# Patient Record
Sex: Male | Born: 1955
Health system: Southern US, Community
[De-identification: ages and names within clinical notes are randomized; demographics above are authoritative.]

## PROBLEM LIST (undated history)

## (undated) DIAGNOSIS — C679 Malignant neoplasm of bladder, unspecified: Secondary | ICD-10-CM

## (undated) DIAGNOSIS — I714 Abdominal aortic aneurysm, without rupture, unspecified: Secondary | ICD-10-CM

## (undated) DIAGNOSIS — I10 Essential (primary) hypertension: Secondary | ICD-10-CM

## (undated) DIAGNOSIS — E119 Type 2 diabetes mellitus without complications: Secondary | ICD-10-CM

## (undated) DIAGNOSIS — C801 Malignant (primary) neoplasm, unspecified: Secondary | ICD-10-CM

## (undated) HISTORY — PX: HERNIA REPAIR: SHX51

## (undated) HISTORY — DX: Malignant neoplasm of bladder, unspecified: C67.9

## (undated) HISTORY — PX: BLADDER SURGERY: SHX569

## (undated) HISTORY — PX: ILEOSTOMY: SHX1783

---

## 2018-01-13 ENCOUNTER — Encounter (HOSPITAL_BASED_OUTPATIENT_CLINIC_OR_DEPARTMENT_OTHER): Payer: Self-pay | Admitting: *Deleted

## 2018-01-13 ENCOUNTER — Emergency Department (HOSPITAL_BASED_OUTPATIENT_CLINIC_OR_DEPARTMENT_OTHER): Payer: Non-veteran care

## 2018-01-13 ENCOUNTER — Emergency Department (HOSPITAL_BASED_OUTPATIENT_CLINIC_OR_DEPARTMENT_OTHER)
Admission: EM | Admit: 2018-01-13 | Discharge: 2018-01-13 | Disposition: A | Discharge status: Home / Self Care | Payer: Non-veteran care | Attending: Emergency Medicine | Admitting: Emergency Medicine

## 2018-01-13 ENCOUNTER — Other Ambulatory Visit: Payer: Self-pay

## 2018-01-13 DIAGNOSIS — E119 Type 2 diabetes mellitus without complications: Secondary | ICD-10-CM | POA: Insufficient documentation

## 2018-01-13 DIAGNOSIS — Z8551 Personal history of malignant neoplasm of bladder: Secondary | ICD-10-CM | POA: Insufficient documentation

## 2018-01-13 DIAGNOSIS — R531 Weakness: Secondary | ICD-10-CM | POA: Insufficient documentation

## 2018-01-13 DIAGNOSIS — R000 Tachycardia, unspecified: Secondary | ICD-10-CM | POA: Diagnosis present

## 2018-01-13 DIAGNOSIS — F17200 Nicotine dependence, unspecified, uncomplicated: Secondary | ICD-10-CM | POA: Diagnosis not present

## 2018-01-13 DIAGNOSIS — R1012 Left upper quadrant pain: Secondary | ICD-10-CM | POA: Insufficient documentation

## 2018-01-13 DIAGNOSIS — I10 Essential (primary) hypertension: Secondary | ICD-10-CM | POA: Insufficient documentation

## 2018-01-13 HISTORY — DX: Abdominal aortic aneurysm, without rupture, unspecified: I71.40

## 2018-01-13 HISTORY — DX: Type 2 diabetes mellitus without complications: E11.9

## 2018-01-13 HISTORY — DX: Abdominal aortic aneurysm, without rupture: I71.4

## 2018-01-13 HISTORY — DX: Malignant (primary) neoplasm, unspecified: C80.1

## 2018-01-13 HISTORY — DX: Essential (primary) hypertension: I10

## 2018-01-13 LAB — CBC WITH DIFFERENTIAL/PLATELET
BASOS ABS: 0 10*3/uL (ref 0.0–0.1)
BASOS PCT: 0 %
EOS ABS: 0.3 10*3/uL (ref 0.0–0.7)
Eosinophils Relative: 3 %
HCT: 46.1 % (ref 39.0–52.0)
HEMOGLOBIN: 16.5 g/dL (ref 13.0–17.0)
Lymphocytes Relative: 23 %
Lymphs Abs: 2.2 10*3/uL (ref 0.7–4.0)
MCH: 29.7 pg (ref 26.0–34.0)
MCHC: 35.8 g/dL (ref 30.0–36.0)
MCV: 82.9 fL (ref 78.0–100.0)
MONO ABS: 0.8 10*3/uL (ref 0.1–1.0)
MONOS PCT: 8 %
NEUTROS ABS: 6.1 10*3/uL (ref 1.7–7.7)
NEUTROS PCT: 66 %
Platelets: 337 10*3/uL (ref 150–400)
RBC: 5.56 MIL/uL (ref 4.22–5.81)
RDW: 14 % (ref 11.5–15.5)
WBC: 9.4 10*3/uL (ref 4.0–10.5)

## 2018-01-13 LAB — BASIC METABOLIC PANEL
ANION GAP: 11 (ref 5–15)
BUN: 14 mg/dL (ref 6–20)
CALCIUM: 9.5 mg/dL (ref 8.9–10.3)
CO2: 20 mmol/L — AB (ref 22–32)
CREATININE: 0.84 mg/dL (ref 0.61–1.24)
Chloride: 104 mmol/L (ref 101–111)
GFR calc non Af Amer: >60 mL/min (ref >60)
Glucose, Bld: 268 mg/dL — ABNORMAL HIGH (ref 65–99)
Potassium: 3.7 mmol/L (ref 3.5–5.1)
Sodium: 135 mmol/L (ref 135–145)

## 2018-01-13 LAB — TROPONIN I

## 2018-01-13 NOTE — ED Notes (Signed)
ED Provider at bedside. 

## 2018-01-13 NOTE — ED Provider Notes (Signed)
[facility] Provider Note   CSN: [id number] Arrival date & time: 01/13/18  1251     History   Chief Complaint Chief Complaint  Patient presents with  . Abdominal Pain    HPI Shaun Pittman is a 62 y.o. male history of bladder cancer status post removal, AAA (last screening measuring 3.2 cm), diabetes, hypertension who presents emergency department today for elevated heart rate.  Patient states that he is concerned he may have ruptured his AAA.  He is concerned because his mother died from a ruptured AAA several years ago.  He notes that he has been having left upper abdominal pain that is crampy in nature for the last 6 months.  This morning he had 3 episodes of nonbloody diarrhea and noticed relief of his pain.  He reports that shortly after this he started having elevated heart rate around 160 as well as an elevated blood pressure of 152/98.  He notes that his symptoms resolved.  He reports earlier that day he did feel he had some left arm weakness but feels this may be related to soreness as he did heavy lifting a few days ago.  He notes this has resolved.  Patient states he is asymptomatic currently. 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 He denies any associated chest pain, shortness of breath, headache, facial droop, difficulty with hearing, changes in vision, diplopia, difficulty with speech, numbness/tingling of the upper extremities, weakness of the lower extremities, vertigo, dizziness, headedness, nausea or vomiting. Patient has a history of bladder removal 4 years ago.  He has a urostomy in place without any complications.  HPI  Past Medical History:  Diagnosis Date  . AAA (abdominal aortic aneurysm) ([place])   . Cancer ([place])   . Diabetes mellitus without complication ([place])   . Hypertension     There are no active problems to display for this patient.   Past  Surgical History:  Procedure Laterality Date  . BLADDER SURGERY    . HERNIA REPAIR    . ILEOSTOMY          Home Medications    Prior to Admission medications   Not on File    Family History No family history on file.  Social History Social History   Tobacco Use  . Smoking status: Current Every Day Smoker  . Smokeless tobacco: Never Used  Substance Use Topics  . Alcohol use: Never    Frequency: Never  . Drug use: Never     Allergies   Sulfa antibiotics and Zithromax [azithromycin]   Review of Systems Review of Systems  All other systems reviewed and are negative.    Physical Exam Updated Vital Signs BP 136/84 (BP Location: Right Arm)   Pulse (!) 106   Temp 97.9 F (36.6 C) (Oral)   Resp 20   Ht 6' (1.829 m)   Wt 80.7 kg (178 lb)   SpO2 99%   BMI 24.14 kg/m   Physical Exam  Constitutional: He appears well-developed and well-nourished.  62 year old male that appears stated age resting comfortably no acute distress  HENT:  Head: Normocephalic and atraumatic.  Right Ear: External ear normal.  Left Ear: External ear normal.  Nose: Nose normal.  Mouth/Throat: Uvula is midline, oropharynx is clear and moist and mucous membranes are normal. No tonsillar exudate.  Eyes: Pupils are equal, round, and reactive to  light. Right eye exhibits no discharge. Left eye exhibits no discharge. No scleral icterus.  Neck: Trachea normal. Neck supple. No spinous process tenderness present. No neck rigidity. Normal range of motion present.  Cardiovascular: Regular rhythm and intact distal pulses. Tachycardia present.  No murmur heard. Pulses:      Radial pulses are 2+ on the right side, and 2+ on the left side.       Femoral pulses are 2+ on the right side, and 2+ on the left side.      Dorsalis pedis pulses are 2+ on the right side, and 2+ on the left side.       Posterior tibial pulses are 2+ on the right side, and 2+ on the left side.  No lower extremity swelling or  edema. Calves symmetric in size bilaterally.  Pulmonary/Chest: Effort normal and breath sounds normal. He exhibits no tenderness.  Abdominal: Soft. Bowel sounds are normal. He exhibits no distension and no pulsatile midline mass. There is no tenderness. There is no rigidity, no rebound, no guarding and no CVA tenderness.  Urostomy bag in place with no surrounding evidence of infection  Musculoskeletal: He exhibits no edema.  Lymphadenopathy:    He has no cervical adenopathy.  Neurological: He is alert.  Mental Status:  Alert, oriented, thought content appropriate, able to give a coherent history. Speech fluent without evidence of aphasia. Able to follow 2 step commands without difficulty.  Cranial Nerves:  II:  Peripheral visual fields grossly normal, pupils equal, round, reactive to light III,IV, VI: ptosis not present, extra-ocular motions intact bilaterally  V,VII: eyebrows raise symmetric, facial light touch sensation equal VIII: hearing grossly normal to voice  X: uvula elevates symmetrically  XI: bilateral shoulder shrug symmetric and strong XII: midline tongue extension without fassiculations Motor:  Normal tone. 5/5 in upper and lower extremities bilaterally including strong and equal grip strength and dorsiflexion/plantar flexion Sensory: Sensation intact to light touch in all extremities. Negative Romberg.  Deep Tendon Reflexes: 2+ and symmetric in the biceps and patella Cerebellar: normal finger-to-nose with bilateral upper extremities. Normal heel-to -shin balance bilaterally of the lower extremity. No pronator drift.  Gait: normal gait and balance CV: distal pulses palpable throughout   Skin: Skin is warm and dry. No rash noted. He is not diaphoretic.  Psychiatric: He has a normal mood and affect.  Nursing note and vitals reviewed.    ED Treatments / Results  Labs (all labs ordered are listed, but only abnormal results are displayed) Labs Reviewed  BASIC METABOLIC  PANEL - Abnormal; Notable for the following components:      Result Value   CO2 20 (*)    Glucose, Bld 268 (*)    All other components within normal limits  CBC WITH DIFFERENTIAL/PLATELET  TROPONIN I    EKG EKG Interpretation  Date/Time:  Thursday Jan 13 2018 12:59:10 EDT Ventricular Rate:  106 PR Interval:  180 QRS Duration: 66 QT Interval:  310 QTC Calculation: 411 R Axis:   71 Text Interpretation:  Sinus tachycardia Otherwise normal ECG No old tracing to compare Confirmed by Jola Schmidt [phone]) on 01/13/2018 1:45:18 PM   Radiology Dg Chest 2 View  Result Date: 01/13/2018 CLINICAL DATA:  Rapid heart rate yesterday and this am, chest pressure, light headed. Hx AAA, enlarged spleen. EXAM: CHEST - 2 VIEW COMPARISON:  None. FINDINGS: The heart size and mediastinal contours are within normal limits. Both lungs are clear. The visualized skeletal structures are unremarkable. IMPRESSION: No active  cardiopulmonary disease. Electronically Signed   By: Nolon Nations M.D.   On: 01/13/2018 13:57   Ct Head Wo Contrast  Result Date: 01/13/2018 CLINICAL DATA:  Hypertension, dizziness, tachycardia, history of bladder carcinoma EXAM: CT HEAD WITHOUT CONTRAST TECHNIQUE: Contiguous axial images were obtained from the base of the skull through the vertex without intravenous contrast. COMPARISON:  None. FINDINGS: Brain: The ventricular system is within normal limits in size age with only mild cortical atrophy present. The septum is midline in position. The fourth ventricle and basilar cisterns are unremarkable. There may be very mild small vessel disease involving the periventricular white matter but no hemorrhage, mass lesion, or acute infarction is seen. Vascular: No vascular abnormality is noted on this unenhanced study. Skull: On bone window images, no calvarial abnormality is seen. Sinuses/Orbits: No paranasal sinus disease is seen. Other: None. IMPRESSION: 1. No acute intracranial abnormality. 2.  Very mild atrophy and perhaps minimal small vessel ischemic change in the periventricular white matter. Electronically Signed   By: Ivar Drape M.D.   On: 01/13/2018 13:52    Procedures Procedures (including critical care time)  Medications Ordered in ED Medications - No data to display   Initial Impression / Assessment and Plan / ED Course  I have reviewed the triage vital signs and the nursing notes.  Pertinent labs & imaging results that were available during my care of the patient were reviewed by me and considered in my medical decision making (see chart for details).     62 y.o. male history of bladder cancer status post removal, AAA (last screening measuring 3.2 cm), diabetes, hypertension who presents emergency department today for elevated heart rate.  Patient states that he is concerned he may have ruptured his AAA.  He is concerned because his mother died from a ruptured AAA several years ago.  He notes that he has been having left upper abdominal pain that is crampy in nature for the last 6 months.  This morning he had 3 episodes of nonbloody diarrhea and noticed relief of his pain.  He reports that shortly after this he started having elevated heart rate around 160 as well as an elevated blood pressure of 152/98.  He notes that his symptoms resolved.  Reports some recent increase in stress. He denies any associated chest pain, shortness of breath, headache, facial droop, difficulty with hearing, changes in vision, diplopia, difficulty with speech, numbness/tingling of the upper extremities, weakness of the lower extremities, vertigo, dizziness, headedness, nausea or vomiting.   Patient resting comfortably on my initial exam.  He is tachycardic at 106.  Vital signs are otherwise reassuring.  No JVD, carotid bruit.  Lungs are clear to auscultation bilaterally.  Heart is tachycardic without an irregular rhythm.  Abdomen is soft and nontender.  He has a urostomy bag present that is  without any surrounding signs of infection.  Bedside ultrasound performed by Dr. Venora Maples that reveals a aortic diameter of 2.75 cm.  Patient with normal neurologic exam.  No focal neurologic deficits.  Pulses of upper and lower extremities equal bilaterally.  Femoral pulses equal bilaterally.  Low suspicion for rupture of AAA at this time given patient's reassuring exam and patient is without any pain at this current time.  Blood pressures taken bilaterally are symmetric.  Question if the patient had an episode of atrial fibrillation.  Will work him up for this.  EKG was sinus tachycardia.  No evidence of atrial fibrillation or other arrhythmia at this time.  CT  head without any acute intracranial abnormality.  Chest x-ray with any active pulmonary disease.  Troponin within normal limits.  Lab work otherwise reassuring. Vital signs have improved.   The evaluation does not show pathology that would require ongoing emergent intervention or inpatient treatment. I advised the patient to follow-up with PCP at [facility] during his scheduled appointment tomorrow morning. I will provide referral to  week. I advised the patient to return to the emergency department with new or worsening symptoms or new concerns. Specific return precautions discussed. The patient verbalized understanding and agreement with plan. All questions answered. No further questions at this time. The patient is hemodynamically stable, mentating appropriately and appears safe for discharge.   Vitals:   01/13/18 1258 01/13/18 1317 01/13/18 1318 01/13/18 1405  BP: (!) 155/88 (!) 154/93 136/84 130/79  Pulse: (!) 106   85  Resp:    18  Temp:      TempSrc:      SpO2: 99%   95%  Weight:      Height:        Final Clinical Impressions(s) / ED Diagnoses   Final diagnoses:  Tachycardia    ED Discharge Orders    None       Lorelle Gibbs 01/13/18 2029    Jola Schmidt, MD 01/18/18 1006

## 2018-01-13 NOTE — ED Triage Notes (Signed)
Left upper quadrant pain for months. Hx of AAA that has never ruptured and enlarged spleen. Left arm is weak.

## 2018-01-13 NOTE — Discharge Instructions (Addendum)
Your work-up was reassuring today. You received blood work, EKG, CT of the head as well as a chest x-ray Your bedside ultrasound was reassuring I am referring you to a cardiologist for further evaluation.  It is possible that you had an episode of atrial fibrillation but we do not see this during your stay in the emergency department. Please call today when you leave today for follow up in the near future.  Keep the appointment with your [facility] doctor tomorrow and discuss the results and today's visit Return for any visual changes, blurring of vision, double vision, changes in hearing, difficulty with speech, weakness on one side of your body, chest pain, shortness of breath, repeat of today's events, increasing abdominal pain or any other concerning symptoms.

## 2018-01-31 ENCOUNTER — Encounter (HOSPITAL_BASED_OUTPATIENT_CLINIC_OR_DEPARTMENT_OTHER): Payer: Self-pay | Admitting: Emergency Medicine

## 2018-01-31 ENCOUNTER — Emergency Department (HOSPITAL_BASED_OUTPATIENT_CLINIC_OR_DEPARTMENT_OTHER)
Admission: EM | Admit: 2018-01-31 | Discharge: 2018-01-31 | Disposition: A | Discharge status: Home / Self Care | Payer: Non-veteran care | Attending: Physician Assistant | Admitting: Physician Assistant

## 2018-01-31 ENCOUNTER — Emergency Department (HOSPITAL_BASED_OUTPATIENT_CLINIC_OR_DEPARTMENT_OTHER): Payer: Non-veteran care

## 2018-01-31 ENCOUNTER — Other Ambulatory Visit: Payer: Self-pay

## 2018-01-31 DIAGNOSIS — I10 Essential (primary) hypertension: Secondary | ICD-10-CM | POA: Diagnosis not present

## 2018-01-31 DIAGNOSIS — F17200 Nicotine dependence, unspecified, uncomplicated: Secondary | ICD-10-CM | POA: Insufficient documentation

## 2018-01-31 DIAGNOSIS — E119 Type 2 diabetes mellitus without complications: Secondary | ICD-10-CM | POA: Diagnosis not present

## 2018-01-31 DIAGNOSIS — J00 Acute nasopharyngitis [common cold]: Secondary | ICD-10-CM | POA: Insufficient documentation

## 2018-01-31 DIAGNOSIS — Z79899 Other long term (current) drug therapy: Secondary | ICD-10-CM | POA: Diagnosis not present

## 2018-01-31 DIAGNOSIS — R0982 Postnasal drip: Secondary | ICD-10-CM | POA: Diagnosis present

## 2018-01-31 DIAGNOSIS — Z7984 Long term (current) use of oral hypoglycemic drugs: Secondary | ICD-10-CM | POA: Insufficient documentation

## 2018-01-31 DIAGNOSIS — Z8551 Personal history of malignant neoplasm of bladder: Secondary | ICD-10-CM | POA: Diagnosis not present

## 2018-01-31 LAB — CBC WITH DIFFERENTIAL/PLATELET
BASOS ABS: 0 10*3/uL (ref 0.0–0.1)
BASOS PCT: 0 %
EOS ABS: 0.1 10*3/uL (ref 0.0–0.7)
Eosinophils Relative: 1 %
HCT: 44.4 % (ref 39.0–52.0)
Hemoglobin: 16 g/dL (ref 13.0–17.0)
Lymphocytes Relative: 16 %
Lymphs Abs: 1.7 10*3/uL (ref 0.7–4.0)
MCH: 30 pg (ref 26.0–34.0)
MCHC: 36 g/dL (ref 30.0–36.0)
MCV: 83.1 fL (ref 78.0–100.0)
MONO ABS: 0.9 10*3/uL (ref 0.1–1.0)
MONOS PCT: 8 %
Neutro Abs: 7.7 10*3/uL (ref 1.7–7.7)
Neutrophils Relative %: 75 %
PLATELETS: 309 10*3/uL (ref 150–400)
RBC: 5.34 MIL/uL (ref 4.22–5.81)
RDW: 13.8 % (ref 11.5–15.5)
WBC: 10.5 10*3/uL (ref 4.0–10.5)

## 2018-01-31 LAB — COMPREHENSIVE METABOLIC PANEL
ALBUMIN: 4.1 g/dL (ref 3.5–5.0)
ALK PHOS: 76 U/L (ref 38–126)
ALT: 19 U/L (ref 17–63)
AST: 21 U/L (ref 15–41)
Anion gap: 12 (ref 5–15)
BILIRUBIN TOTAL: 0.7 mg/dL (ref 0.3–1.2)
BUN: 14 mg/dL (ref 6–20)
CALCIUM: 9.2 mg/dL (ref 8.9–10.3)
CO2: 19 mmol/L — ABNORMAL LOW (ref 22–32)
CREATININE: 0.79 mg/dL (ref 0.61–1.24)
Chloride: 105 mmol/L (ref 101–111)
GFR calc non Af Amer: >60 mL/min (ref >60)
GLUCOSE: 188 mg/dL — AB (ref 65–99)
Potassium: 3.8 mmol/L (ref 3.5–5.1)
Sodium: 136 mmol/L (ref 135–145)
TOTAL PROTEIN: 7.6 g/dL (ref 6.5–8.1)

## 2018-01-31 LAB — D-DIMER, QUANTITATIVE (NOT AT ARMC): D DIMER QUANT: 0.41 ug{FEU}/mL (ref 0.00–0.50)

## 2018-01-31 NOTE — ED Notes (Signed)
NAD at this time. Pt is stable and going home.  

## 2018-01-31 NOTE — ED Provider Notes (Signed)
[facility] Provider Note   CSN: [id number] Arrival date & time: 01/31/18  1023     History   Chief Complaint Chief Complaint  Patient presents with  . Cough    HPI Joaquim Tolen is a 62 y.o. male.  HPI   Patient is a 62 year old male with past medical history significant for AAA, bladder cancer with colostomy, diabetes, hypertension.  He is presenting today with coughing up blood.  Patient reports that he has had "a [place] time trouble with his sinuses".  Patient has been seen by multiple physicians and told that he should not have surgery but they are very narrow.  Patient has noticed that he has been having some more sinus drainage and then coughing up small amounts of blood with the mucus.  Patient does not believe that he is coughing up from his chest, he thinks it is most likely mucosal.  However he is worried about a blood clot today.  Patient had no leg swelling.  No recent travel.  Patient was recently seen in the emergency department and diagnosed with tachycardia with follow-up Holter monitor that was recently returned to the [place].  Patient denies any chest pain or shortness of breath.  However endorses the 2-day history of coughing up blood.  Past Medical History:  Diagnosis Date  . AAA (abdominal aortic aneurysm) ([place])   . Cancer ([place])   . Diabetes mellitus without complication ([place])   . Hypertension     There are no active problems to display for this patient.   Past Surgical History:  Procedure Laterality Date  . BLADDER SURGERY    . HERNIA REPAIR    . ILEOSTOMY          Home Medications    Prior to Admission medications   Medication Sig Start Date End Date Taking? Authorizing Provider  Alogliptin Benzoate 25 MG TABS Take by mouth.   Yes [provider]  amLODipine (NORVASC) 10 MG tablet Take 10 mg by mouth daily.   Yes [provider]  metFORMIN (GLUCOPHAGE) 1000 MG tablet Take 1,000 mg by mouth 2 (two)  times daily with a meal.   Yes [provider]    Family History History reviewed. No pertinent family history.  Social History Social History   Tobacco Use  . Smoking status: Current Every Day Smoker  . Smokeless tobacco: Never Used  Substance Use Topics  . Alcohol use: Never    Frequency: Never  . Drug use: Never     Allergies   Sulfa antibiotics and Zithromax [azithromycin]   Review of Systems Review of Systems  Constitutional: Positive for fatigue. Negative for activity change and fever.  HENT: Positive for congestion, postnasal drip, sinus pressure and sinus pain. Negative for voice change.   Respiratory: Negative for shortness of breath.   Cardiovascular: Negative for chest pain.  Gastrointestinal: Negative for abdominal pain.  All other systems reviewed and are negative.    Physical Exam Updated Vital Signs BP (!) 149/89 (BP Location: Left Arm)   Pulse (!) 106   Temp 98.9 F (37.2 C) (Oral)   Resp 18   Ht 6' (1.829 m)   Wt 80.7 kg (178 lb)   SpO2 95%   BMI 24.14 kg/m   Physical Exam  Constitutional: He is oriented to person, place, and time. He appears well-nourished.  HENT:  Head: Normocephalic.  Mild erythema to bilateral turbinates.  Eyes: Conjunctivae are normal.  Cardiovascular: Normal rate and regular rhythm.  Pulmonary/Chest: Effort normal. No stridor. No respiratory distress.  Abdominal: Soft. He exhibits no distension. There is no tenderness.  Neurological: He is oriented to person, place, and time.  Skin: Skin is warm and dry. He is not diaphoretic.  Psychiatric: He has a normal mood and affect. His behavior is normal.     ED Treatments / Results  Labs (all labs ordered are listed, but only abnormal results are displayed) Labs Reviewed  COMPREHENSIVE METABOLIC PANEL  CBC WITH DIFFERENTIAL/PLATELET  D-DIMER, QUANTITATIVE (NOT AT [facility])    EKG None  Radiology Dg Chest 2 View  Result Date: 01/31/2018 CLINICAL DATA:   Hemoptysis.  Cough. EXAM: CHEST - 2 VIEW COMPARISON:  None. FINDINGS: The heart size is normal. There is no edema or effusion. No focal airspace disease present. Interstitial coarsening is present bilaterally. Degenerative changes are present in the thoracic spine. Surgical clips are present at the gallbladder fossa. IMPRESSION: 1. No acute cardiopulmonary disease. 2. Mild interstitial coarsening is likely chronic. Electronically Signed   By: San Morelle M.D.   On: 01/31/2018 10:56    Procedures Procedures (including critical care time)  Medications Ordered in ED Medications - No data to display   Initial Impression / Assessment and Plan / ED Course  I have reviewed the triage vital signs and the nursing notes.  Pertinent labs & imaging results that were available during my care of the patient were reviewed by me and considered in my medical decision making (see chart for details).      Patient is a 62 year old male with past medical history significant for AAA, bladder cancer with colostomy, diabetes, hypertension.  He is presenting today with coughing up blood.  Patient reports that he has had "a [place] time trouble with his sinuses".  Patient has been seen by multiple physicians and told that he should not have surgery but they are very narrow.  Patient has noticed that he has been having some more sinus drainage and then coughing up small amounts of blood with the mucus.  Patient does not believe that he is coughing up from his chest, he thinks it is most likely mucosal.  However he is worried about a blood clot today.  Patient had no leg swelling.  No recent travel.  Patient was recently seen in the emergency department and diagnosed with tachycardia with follow-up Holter monitor that was recently returned to the [place].  Patient denies any chest pain or shortness of breath.  However endorses the 2-day history of coughing up blood.  12:05 PM Very much suspect that this is due to  friable mucosa secondary to multiple allergies in the setting of pollen and a new dog.  However given high risk (new tachycardia along with history of bladder cancer) will get d-dimer.  Chest x-ray is reassuring.  D-dimer negative. Will have follow up with PCP.  Final Clinical Impressions(s) / ED Diagnoses   Final diagnoses:  None    ED Discharge Orders    None       [place], Fredia Sorrow, MD 01/31/18 1258

## 2018-01-31 NOTE — ED Triage Notes (Signed)
Patient states that yesterday he has a "weird headache" and since he has had sinus drainage into his thraat and cough with clots in it.

## 2018-01-31 NOTE — Discharge Instructions (Addendum)
We think that this is likely irritation of your mucosa.  Please use coolmist or warm mist vaporizer.  Please stay hydrated.  Please stay away from allergens such as dog dander, or pollen.  Please follow-up with your primary care physician for further work-up.

## 2018-06-08 ENCOUNTER — Other Ambulatory Visit (HOSPITAL_BASED_OUTPATIENT_CLINIC_OR_DEPARTMENT_OTHER): Payer: Self-pay | Admitting: Family Medicine

## 2018-06-08 DIAGNOSIS — I714 Abdominal aortic aneurysm, without rupture, unspecified: Secondary | ICD-10-CM

## 2018-06-15 ENCOUNTER — Ambulatory Visit (HOSPITAL_BASED_OUTPATIENT_CLINIC_OR_DEPARTMENT_OTHER)
Admission: RE | Admit: 2018-06-15 | Discharge: 2018-06-15 | Disposition: A | Discharge status: Home / Self Care | Payer: No Typology Code available for payment source | Source: Ambulatory Visit | Attending: Family Medicine | Admitting: Family Medicine

## 2018-06-15 DIAGNOSIS — I714 Abdominal aortic aneurysm, without rupture, unspecified: Secondary | ICD-10-CM

## 2019-08-13 ENCOUNTER — Other Ambulatory Visit: Payer: Self-pay

## 2019-08-13 ENCOUNTER — Emergency Department (HOSPITAL_BASED_OUTPATIENT_CLINIC_OR_DEPARTMENT_OTHER)
Admission: EM | Admit: 2019-08-13 | Discharge: 2019-08-14 | Disposition: A | Discharge status: Home / Self Care | Payer: No Typology Code available for payment source | Attending: Emergency Medicine | Admitting: Emergency Medicine

## 2019-08-13 ENCOUNTER — Encounter (HOSPITAL_BASED_OUTPATIENT_CLINIC_OR_DEPARTMENT_OTHER): Payer: Self-pay | Admitting: Emergency Medicine

## 2019-08-13 DIAGNOSIS — Z79899 Other long term (current) drug therapy: Secondary | ICD-10-CM | POA: Insufficient documentation

## 2019-08-13 DIAGNOSIS — Z7984 Long term (current) use of oral hypoglycemic drugs: Secondary | ICD-10-CM | POA: Diagnosis not present

## 2019-08-13 DIAGNOSIS — I10 Essential (primary) hypertension: Secondary | ICD-10-CM | POA: Insufficient documentation

## 2019-08-13 DIAGNOSIS — Z859 Personal history of malignant neoplasm, unspecified: Secondary | ICD-10-CM | POA: Insufficient documentation

## 2019-08-13 DIAGNOSIS — R310 Gross hematuria: Secondary | ICD-10-CM | POA: Diagnosis not present

## 2019-08-13 DIAGNOSIS — F17200 Nicotine dependence, unspecified, uncomplicated: Secondary | ICD-10-CM | POA: Diagnosis not present

## 2019-08-13 DIAGNOSIS — E119 Type 2 diabetes mellitus without complications: Secondary | ICD-10-CM | POA: Diagnosis not present

## 2019-08-13 DIAGNOSIS — R319 Hematuria, unspecified: Secondary | ICD-10-CM | POA: Diagnosis present

## 2019-08-13 NOTE — ED Triage Notes (Signed)
Patient states that he started to have noted bleeding to his urine through his urostomy bag starting tonight - the patient states that the urine is now clear after emptying the bag PTA

## 2019-08-14 NOTE — ED Provider Notes (Signed)
[facility] EMERGENCY DEPARTMENT Provider Note   CSN: [id number] Arrival date & time: 08/13/19  2155     History   Chief Complaint Chief Complaint  Patient presents with  . Hematuria    HPI Shaun Pittman is a 63 y.o. male.     HPI  This is a 63 year old male with a history of bladder cancer status post cystectomy and urostomy placement, diabetes, hypertension who presents with bloody urine noted in his urostomy bag.  Patient reports that around 8:00 he noted frank blood in his urostomy bag.  He drained the bag and blood continue to accumulate.  He then changed his urostomy bag and has not noted any bleeding since.  That was approximately 4 hours ago.  He states that he cuts his own bags and suspects that he may have irritated the stoma with a jagged area around the hole.  He is otherwise asymptomatic.  No back pains, no fever.  He states his stoma is fairly insensate so he cannot tell me whether it hurts at all.  Past Medical History:  Diagnosis Date  . AAA (abdominal aortic aneurysm) ([place])   . Cancer ([place])   . Diabetes mellitus without complication ([place])   . Hypertension     There are no active problems to display for this patient.   Past Surgical History:  Procedure Laterality Date  . BLADDER SURGERY    . HERNIA REPAIR    . ILEOSTOMY          Home Medications    Prior to Admission medications   Medication Sig Start Date End Date Taking? Authorizing Provider  Alogliptin Benzoate 25 MG TABS Take by mouth.    [provider]  amLODipine (NORVASC) 10 MG tablet Take 10 mg by mouth daily.    [provider]  metFORMIN (GLUCOPHAGE) 1000 MG tablet Take 1,000 mg by mouth 2 (two) times daily with a meal.    [provider]    Family History History reviewed. No pertinent family history.  Social History Social History   Tobacco Use  . Smoking status: Current Every Day Smoker  . Smokeless tobacco: Never Used  Substance Use Topics   . Alcohol use: Never    Frequency: Never  . Drug use: Never     Allergies   Sulfa antibiotics and Zithromax [azithromycin]   Review of Systems Review of Systems  Constitutional: Negative for fever.  Gastrointestinal: Negative for abdominal pain.  Genitourinary: Positive for hematuria. Negative for flank pain.  Skin: Positive for color change.  All other systems reviewed and are negative.    Physical Exam Updated Vital Signs BP (!) 162/83 (BP Location: Left Arm)   Pulse 78   Temp 98.6 F (37 C) (Oral)   Resp 18   Ht 1.829 m (6')   Wt 80.7 kg   SpO2 100%   BMI 24.14 kg/m   Physical Exam Vitals signs and nursing note reviewed.  Constitutional:      Appearance: He is well-developed. He is not ill-appearing.  HENT:     Head: Normocephalic and atraumatic.  Eyes:     Pupils: Pupils are equal, round, and reactive to light.  Neck:     Musculoskeletal: Neck supple.  Cardiovascular:     Rate and Rhythm: Normal rate and regular rhythm.  Pulmonary:     Effort: Pulmonary effort is normal. No respiratory distress.  Abdominal:     Palpations: Abdomen is soft.     Tenderness: There is no  abdominal tenderness. There is no rebound.     Comments: Urostomy in right mid abdomen, stoma pink without obvious bleeding, there is slight irritation inferiorly, clear urine noted in the urostomy bag, upon removal of the urostomy bag there is some skin irritation inferiorly  Skin:    General: Skin is warm and dry.  Neurological:     Mental Status: He is alert and oriented to person, place, and time.  Psychiatric:        Mood and Affect: Mood normal.      ED Treatments / Results  Labs (all labs ordered are listed, but only abnormal results are displayed) Labs Reviewed - No data to display  EKG None  Radiology No results found.  Procedures Procedures (including critical care time)  Medications Ordered in ED Medications - No data to display   Initial Impression /  Assessment and Plan / ED Course  I have reviewed the triage vital signs and the nursing notes.  Pertinent labs & imaging results that were available during my care of the patient were reviewed by me and considered in my medical decision making (see chart for details).        Patient presents with noted blood in his urostomy.  He is overall nontoxic and vital signs are reassuring.  He did now has clear urine and has for the last 4 hours.  Urine cleared up after changing his urostomy bag.  Patient suspects irritation at this time with his prior bag.  There is no obvious injury to the stoma but there is some irritation inferiorly which could suggest recent bleeding.  Patient is otherwise asymptomatic and his abdomen is soft.  At this time, feel very reassured that his urine has cleared up and have low suspicion for intra-abdominal bleeding.  Suspect that he likely had some external bleeding from his stoma which was corrected with removal of irritation.  Patient was reassured.  I did encourage him to follow-up with his urologist.  Do not see any utility in sending urine at this time.  After history, exam, and medical workup I feel the patient has been appropriately medically screened and is safe for discharge home. Pertinent diagnoses were discussed with the patient. Patient was given return precautions.   Final Clinical Impressions(s) / ED Diagnoses   Final diagnoses:  Gross hematuria    ED Discharge Orders    None       Captain Blucher, Barbette Hair, MD 08/14/19 [phone]

## 2019-08-14 NOTE — Discharge Instructions (Addendum)
Your seen today after he noted blood in your urostomy bag.  There was no notable blood on examination and your stoma appeared intact although there was some irritation inferiorly.  Given that the urine has cleared up, I do not feel you need further work-up at this time.  To call your urologist for follow-up.  If you note gross blood or have any change in symptoms you should be reevaluated.

## 2019-09-06 IMAGING — CT CT HEAD W/O CM
3 series · 14 of 46 positions shown, 16 images · non-contrast
Comparison: None.

CLINICAL DATA: Hypertension, dizziness, tachycardia, history of
bladder carcinoma

EXAM:
CT HEAD WITHOUT CONTRAST
TECHNIQUE: Contiguous axial images were obtained from the base of the skull
through the vertex without intravenous contrast.

[Series 2: head wo · axial · 0.43mm/px · z∈[+1028,+1148]mm · 8 of 29 slices shown, 10 images]
[im 3/29  brain]
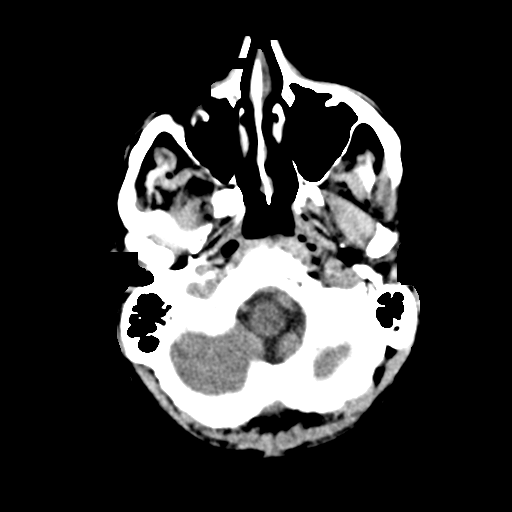
[im 3/29  bone]
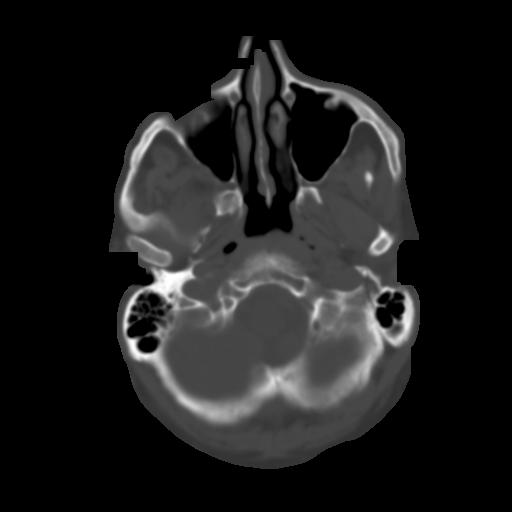
[im 7/29  brain]
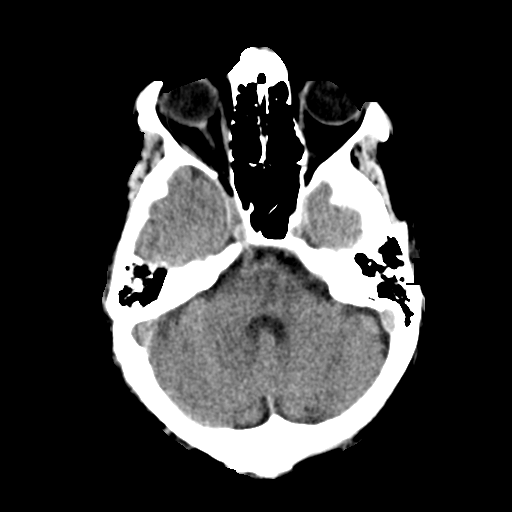
[im 10/29  brain]
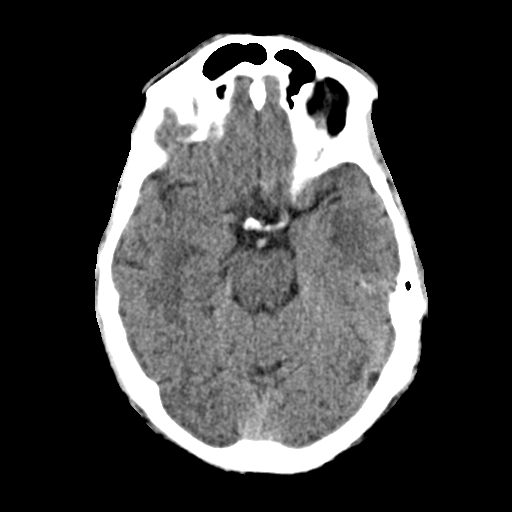
[im 13/29  brain]
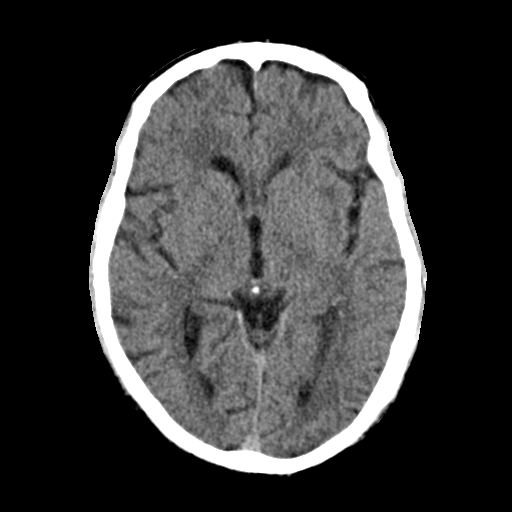
[im 17/29  brain]
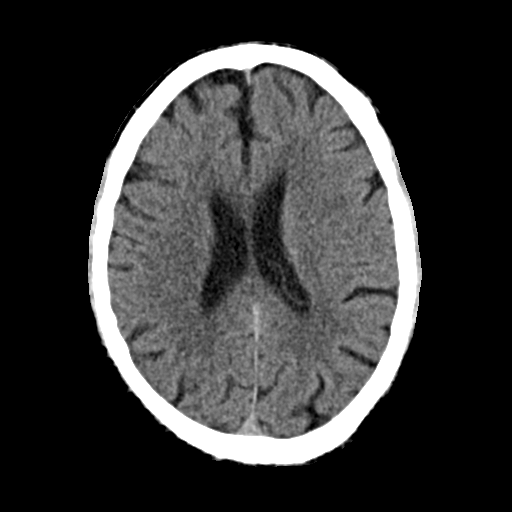
[im 17/29  bone]
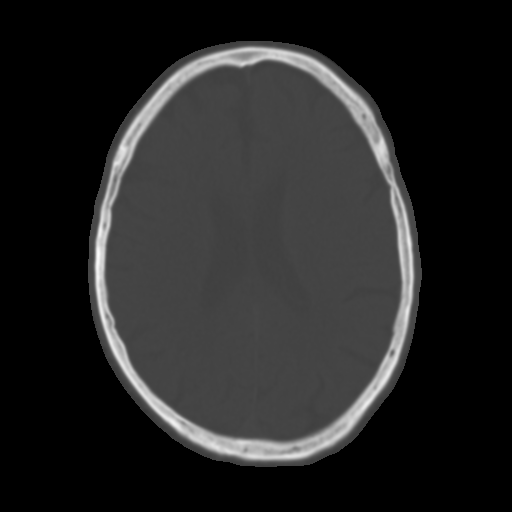
[im 20/29  brain]
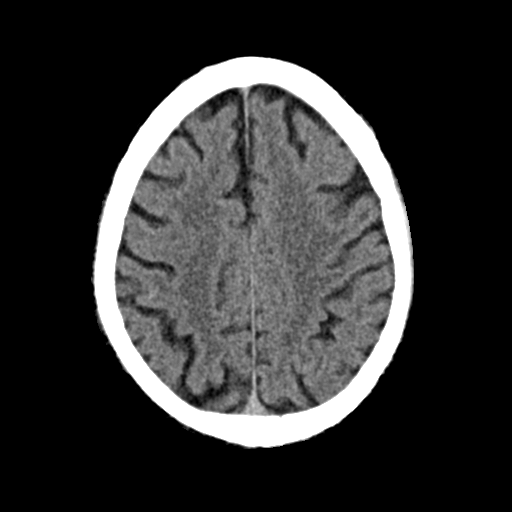
[im 23/29  brain]
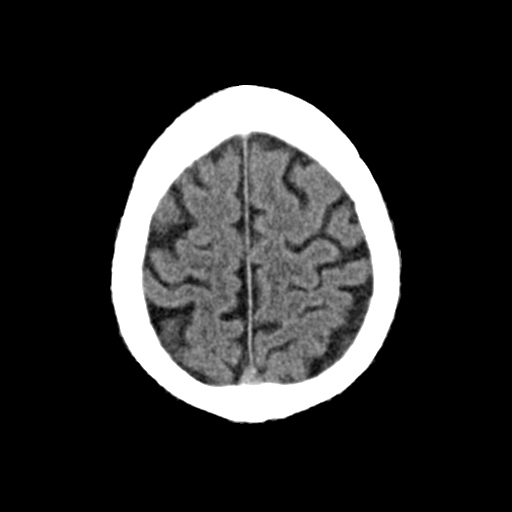
[im 27/29  brain]
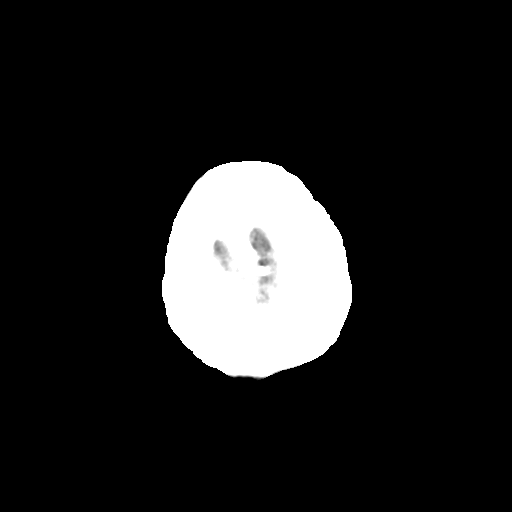

[Series 4: coronal soft · coronal · 0.29mm/px · 3 of 68 slices shown]
[im 23/68  brain]
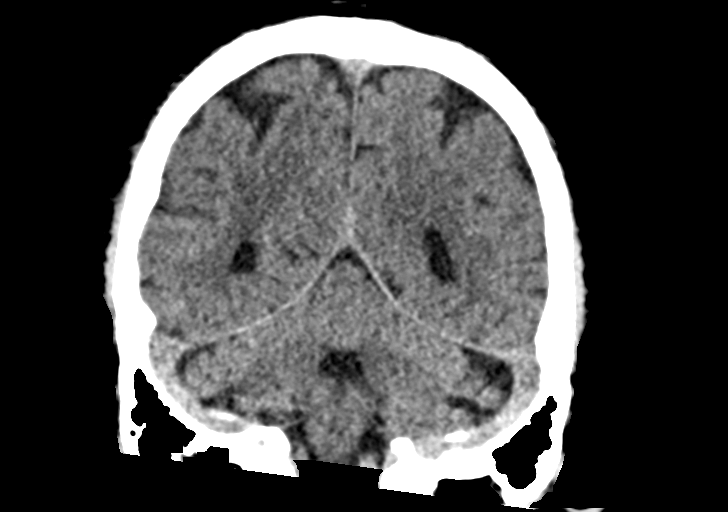
[im 30/68  brain]
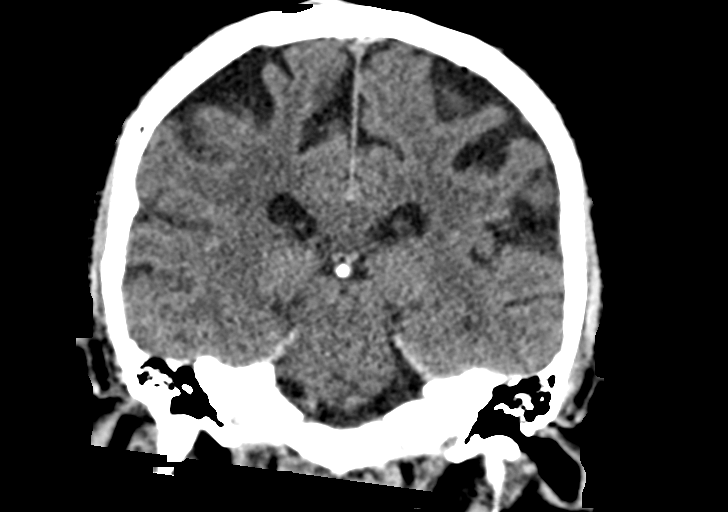
[im 38/68  brain]
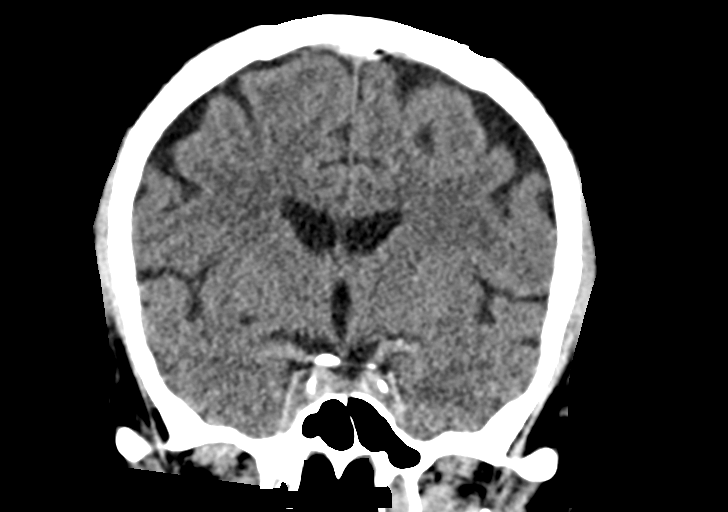

[Series 5: sag soft · sagittal · 0.31mm/px · 3 of 57 slices shown]
[im 19/57  brain]
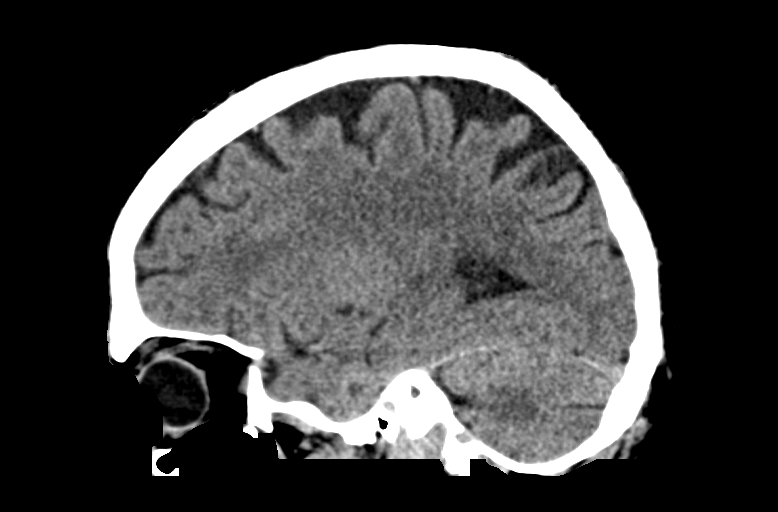
[im 29/57  brain]
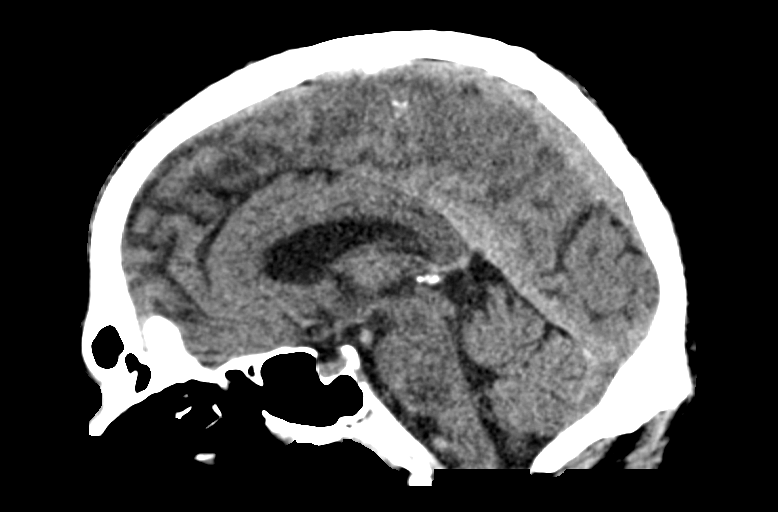
[im 38/57  brain]
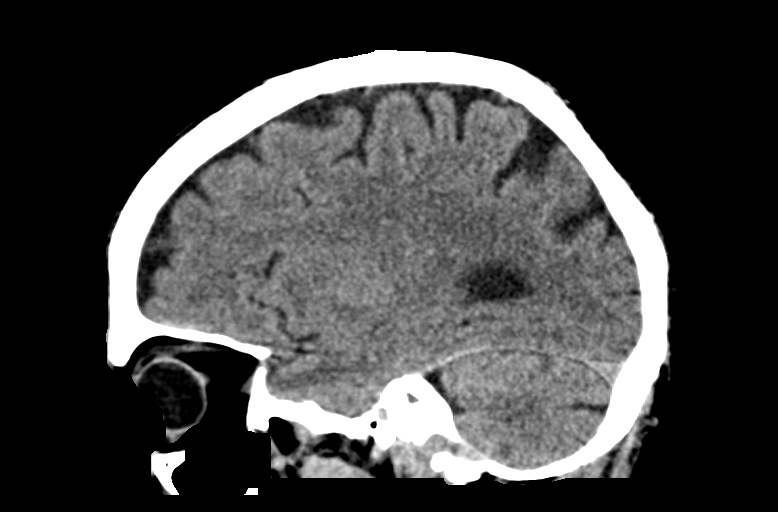

[14 of 46 positions shown; findings below may reference images not displayed]

FINDINGS: Brain: The ventricular system is within normal limits in size age
with only mild cortical atrophy present. The septum is midline in
position. The fourth ventricle and basilar cisterns are
unremarkable. There may be very mild small vessel disease involving
the periventricular white matter but no hemorrhage, mass lesion, or
acute infarction is seen.

Vascular: No vascular abnormality is noted on this unenhanced study.

Skull: On bone window images, no calvarial abnormality is seen.

Sinuses/Orbits: No paranasal sinus disease is seen.

Other: None.
IMPRESSION: 1. No acute intracranial abnormality.
2. Very mild atrophy and perhaps minimal small vessel ischemic
change in the periventricular white matter.

## 2019-11-17 ENCOUNTER — Other Ambulatory Visit: Payer: Self-pay

## 2019-11-17 ENCOUNTER — Other Ambulatory Visit (INDEPENDENT_AMBULATORY_CARE_PROVIDER_SITE_OTHER): Payer: No Typology Code available for payment source

## 2019-11-17 ENCOUNTER — Encounter: Payer: Self-pay | Admitting: Nurse Practitioner

## 2019-11-17 ENCOUNTER — Ambulatory Visit (INDEPENDENT_AMBULATORY_CARE_PROVIDER_SITE_OTHER): Payer: No Typology Code available for payment source | Admitting: Nurse Practitioner

## 2019-11-17 VITALS — BP 146/68 | HR 75 | Temp 97.9°F | Ht 72.0 in | Wt 176.0 lb

## 2019-11-17 DIAGNOSIS — K769 Liver disease, unspecified: Secondary | ICD-10-CM

## 2019-11-17 DIAGNOSIS — D7389 Other diseases of spleen: Secondary | ICD-10-CM | POA: Diagnosis not present

## 2019-11-17 DIAGNOSIS — D1803 Hemangioma of intra-abdominal structures: Secondary | ICD-10-CM | POA: Diagnosis not present

## 2019-11-17 LAB — CBC WITH DIFFERENTIAL/PLATELET
Basophils Absolute: 0 10*3/uL (ref 0.0–0.1)
Basophils Relative: 0.2 % (ref 0.0–3.0)
Eosinophils Absolute: 0.5 10*3/uL (ref 0.0–0.7)
Eosinophils Relative: 4.2 % (ref 0.0–5.0)
HCT: 44.2 % (ref 39.0–52.0)
Hemoglobin: 15.5 g/dL (ref 13.0–17.0)
Lymphocytes Relative: 21.8 % (ref 12.0–46.0)
Lymphs Abs: 2.3 10*3/uL (ref 0.7–4.0)
MCHC: 35 g/dL (ref 30.0–36.0)
MCV: 84 fl (ref 78.0–100.0)
Monocytes Absolute: 0.9 10*3/uL (ref 0.1–1.0)
Monocytes Relative: 8.3 % (ref 3.0–12.0)
Neutro Abs: 7.1 10*3/uL (ref 1.4–7.7)
Neutrophils Relative %: 65.5 % (ref 43.0–77.0)
Platelets: 380 10*3/uL (ref 150.0–400.0)
RBC: 5.26 Mil/uL (ref 4.22–5.81)
RDW: 13.8 % (ref 11.5–15.5)
WBC: 10.8 10*3/uL — ABNORMAL HIGH (ref 4.0–10.5)

## 2019-11-17 LAB — COMPREHENSIVE METABOLIC PANEL
ALT: 24 U/L (ref 0–53)
AST: 19 U/L (ref 0–37)
Albumin: 4.3 g/dL (ref 3.5–5.2)
Alkaline Phosphatase: 81 U/L (ref 39–117)
BUN: 14 mg/dL (ref 6–23)
CO2: 26 mEq/L (ref 19–32)
Calcium: 9.9 mg/dL (ref 8.4–10.5)
Chloride: 103 mEq/L (ref 96–112)
Creatinine, Ser: 0.83 mg/dL (ref 0.40–1.50)
GFR: 92.75 mL/min (ref >60.00)
Glucose, Bld: 135 mg/dL — ABNORMAL HIGH (ref 70–99)
Potassium: 4 mEq/L (ref 3.5–5.1)
Sodium: 137 mEq/L (ref 135–145)
Total Bilirubin: 0.4 mg/dL (ref 0.2–1.2)
Total Protein: 7.7 g/dL (ref 6.0–8.3)

## 2019-11-17 NOTE — Progress Notes (Signed)
11/17/2019 Xzavion Cackowski [id number] Sep 22, 1955   CHIEF COMPLAINT: questionable liver disease   HISTORY OF PRESENT ILLNESS:  Aaryav Schiffler is a 64 year old male with a past medical history of  hypertension, DM II, benign thyroid nodule, bladder cancer s/p cystoprostatectomy with pelvic lymph node dissection and ileal conduit 07/2014 and diverticulosis. S/P cholecystectomy 30 years ago. He was referred to our office by his [place] physician for further evaluation regarding abdominal imaging which indicated hepatocellular disease. He denies having any prior history of liver disease/hepatitis. Never received a blood transfusion prior to 1980, no history of IV/nasal drug use, no tattoos or known exposure to hepatitis B and C.  No alcohol use. He developed LUQ abdominal pain Oct. 2020 which he noticed occurred after he switched Metamucil to another fiber supplement. An abdominal sonogram was done 07/05/2019 which showed splenomegaly  with a 5.7cm isoechoic splenic lesion reported as stable when compared to 2018 CT, most likely a hemangioma. The liver was noted to have echogenic hepatic parencyma suggestive of hepatocellular disease. Labs 07/05/2019 showed normal LFTs. AST 31. ALT 49. Hg 15.8. PLT 356. His LUQ pain resolved after he switched back to Metamucil. He denies any further LUQ pain sine then. No dysphagia or heartburn. He is passing a normal formed brown stool once daily. No rectal bleeding or black stools. He never had a colonoscopy due to his concerns of perforation with having diverticular disease. He is not interested in scheduling a colonoscopy at this time. He completed a Cologuard test about 1 year ago which he reported was negative. I will request a copy of his Cologuard report. He has a surveillance abd/pelvic CT annually. His most recent abdominal/pelvic CT was done 07/25/2019 showed an unchanged lesion within the spleen, an infrarenal abdominal aortic aneurysm measuring 3.1 cm and post surgical changes  of cystoprostatectomy with ileal conduit, no evidence of recurrence or metastasis. Stable weight. No fever, sweats or chills. No other complaints today. No family history of liver disease or liver cancer.    Past Medical History:  Diagnosis Date  . AAA (abdominal aortic aneurysm) ([place])   . Cancer ([place])   . Diabetes mellitus without complication ([place])   . Hypertension    Past Surgical History:  Procedure Laterality Date  . BLADDER SURGERY    . HERNIA REPAIR    . ILEOSTOMY      Social History:  He is divorced. Retired. He smokes cigarettes. No alcohol or drug use.   Family History: Sister with bladder cancer. No family history of esophageal, gastric or colon cancer.    reports that he has been smoking. He has never used smokeless tobacco. He reports that he does not drink alcohol or use drugs. He smokes 1/3 ppd of and on 20 years. Using Nicotine patch in process of quitting.  family history is not on file. Allergies  Allergen Reactions  . Sulfa Antibiotics     rash  . Zithromax [Azithromycin]     rash      Outpatient Encounter Medications as of 11/17/2019  Medication Sig  . amLODipine (NORVASC) 10 MG tablet Take 10 mg by mouth daily.  Marland Kitchen aspirin 81 MG EC tablet Take 81 mg by mouth daily. Swallow whole.  . cholecalciferol (VITAMIN D3) 25 MCG (1000 UNIT) tablet Take 1,000 Units by mouth daily.  . magnesium 30 MG tablet Take 30 mg by mouth daily.  . metFORMIN (GLUCOPHAGE) 1000 MG tablet Take 1,000 mg by mouth 2 (two) times daily with a  meal.  . Multiple Vitamin (MULTIVITAMIN) tablet Take 1 tablet by mouth daily.  . Omega-3 Fatty Acids (FISH OIL) 1200 MG CAPS Take 1 capsule by mouth daily.  . [DISCONTINUED] Alogliptin Benzoate 25 MG TABS Take by mouth.   No facility-administered encounter medications on file as of 11/17/2019.     REVIEW OF SYSTEMS: All other systems reviewed and negative except where noted in the History of Present Illness.   PHYSICAL EXAM: BP (!) 146/68    Pulse 75   Temp 97.9 F (36.6 C)   Ht 6' (1.829 m)   Wt 176 lb (79.8 kg)   BMI 23.87 kg/m  General: Well developed  64 year old male in no acute distress. Head: Normocephalic and atraumatic. Eyes:  Sclerae non-icteric, conjunctive pink. Ears: Normal auditory acuity. Mouth: Scattered missing dentition. No ulcers or lesions.  Neck: Supple, no lymphadenopathy or thyromegaly.  Lungs: Clear bilaterally to auscultation without wheezes, crackles or rhonchi. Heart: Regular rate and rhythm. No murmur, rub or gallop appreciated.  Abdomen: Soft, nontender, non distended. No masses. No hepatosplenomegaly. Normoactive bowel sounds x 4 quadrants.  Rectal: Deferred.  Musculoskeletal: Symmetrical with no gross deformities. Skin: Warm and dry. No rash or lesions on visible extremities. Extremities: No edema. Neurological: Alert oriented x 4, no focal deficits.  Psychological:  Alert and cooperative. Normal mood and affect.  ASSESSMENT AND PLAN:  49. 64 year old male with a history of bladder cancer presents for further evaluation regarding CT/sono evidence of echogenic hepatic parencyma suggestive of hepatocellular disease, most likely hepatic steatosis. Normal AST/ALT and PLT counts. BMI 24%. -CBC and CMP today -Avoid weight gain, exercise as tolerated, healthy diet discussed -Consider abdominal sonogram with elastography to assess for fibrosis/risk of cirrhosis and Hep B and C serologies, I will consult with Dr. Havery Moros prior to ordering these tests.   2. Splenomegaly with a stable splenic lesion, most likely hemangioma.  -Continue annual surveillance CTAP as followed by his oncologist  -? Abdominal MRI for further evaluation   3. Colon cancer screening. Patient declines conventional colonoscopy due to risk of perforation with diverticular disease. -Request copy of Cologuard test from the [facility] in [place]  Further follow up to be determined, await input from Dr. Havery Moros   CC:   Cl[facility], [facility]

## 2019-11-17 NOTE — Patient Instructions (Addendum)
If you are age 66 or older, your body mass index should be between 23-30. Your Body mass index is 23.87 kg/m. If this is out of the aforementioned range listed, please consider follow up with your Primary Care Provider.  If you are age 43 or younger, your body mass index should be between 19-25. Your Body mass index is 23.87 kg/m. If this is out of the aformentioned range listed, please consider follow up with your Primary Care Provider.   Your provider has requested that you go to the basement level for lab work before leaving today. Press "B" on the elevator. The lab is located at the first door on the left as you exit the elevator.  We will request your cologuard test from the [facility]  A follow up to be determined after lab results are received.  Due to recent changes in healthcare laws, you may see the results of your imaging and laboratory studies on MyChart before your provider has had a chance to review them.  We understand that in some cases there may be results that are confusing or concerning to you. Not all laboratory results come back in the same time frame and the provider may be waiting for multiple results in order to interpret others.  Please give [place] 48 hours in order for your provider to thoroughly review all the results before contacting the office for clarification of your results.   Thank you for choosing [facility] Noralyn Pick, CRNP

## 2019-11-18 DIAGNOSIS — D7389 Other diseases of spleen: Secondary | ICD-10-CM | POA: Insufficient documentation

## 2019-11-20 NOTE — Progress Notes (Signed)
Agree with assessment and plan as outlined. Likely fatty liver causing imaging findings with normal liver enzymes. Splenomegaly noted on CT imaging but liver not reported to be cirrhotic. His platelets are normal, would check baseline INR but cirrhosis seems less likely based on lab evaluation right now and no overt CT evidence of cirrhosis. Given his age would screen for hep C, would also add Hep B and TIBC / ferritin panel to ensure normal / negative but I don't think he warrants anything else right now. He should have his liver enzymes checked  yearly and sounds like his oncologist is following splenic lesion with annual CT, don't think we need to pursue elastography right now.

## 2019-11-23 ENCOUNTER — Telehealth: Payer: Self-pay | Admitting: Nurse Practitioner

## 2019-11-23 DIAGNOSIS — K769 Liver disease, unspecified: Secondary | ICD-10-CM

## 2019-11-23 DIAGNOSIS — D7389 Other diseases of spleen: Secondary | ICD-10-CM

## 2019-11-23 DIAGNOSIS — D1803 Hemangioma of intra-abdominal structures: Secondary | ICD-10-CM

## 2019-11-23 NOTE — Telephone Encounter (Signed)
Called and spoke with patient-patient informed of provider's recommendations; patient is agreeable with plan of care and has agreed to complete lab work at his earliest convenience; lab orders have been placed in [place];  Patient verbalized understanding of information/instructions;  Patient was advised to call the office at [phone] if questions/concerns arise;

## 2019-11-23 NOTE — Telephone Encounter (Signed)
Bre, pls call the patient and let him know I reviewed his record with Dr .Havery Moros following his office consult. His abd sonogram is consistent with fatty liver. Pls send pt to lab to check Hep C antibody, Hep B surface antigen, Hep B core total antibody and Hep B surface antibody, TIBC, iron/ ferritin panel as recommended by Dr Havery Moros. Also pt should have his liver enzymes checked yearly and continue follow up with his oncologist regarding his splenic lesions. thx

## 2019-11-24 ENCOUNTER — Other Ambulatory Visit (INDEPENDENT_AMBULATORY_CARE_PROVIDER_SITE_OTHER): Payer: No Typology Code available for payment source

## 2019-11-24 DIAGNOSIS — D1803 Hemangioma of intra-abdominal structures: Secondary | ICD-10-CM | POA: Diagnosis not present

## 2019-11-24 DIAGNOSIS — D7389 Other diseases of spleen: Secondary | ICD-10-CM | POA: Diagnosis not present

## 2019-11-24 DIAGNOSIS — K769 Liver disease, unspecified: Secondary | ICD-10-CM | POA: Diagnosis not present

## 2019-11-24 LAB — IBC + FERRITIN
Ferritin: 55 ng/mL (ref 22.0–322.0)
Iron: 75 ug/dL (ref 42–165)
Saturation Ratios: 18.4 % — ABNORMAL LOW (ref 20.0–50.0)
Transferrin: 291 mg/dL (ref 212.0–360.0)

## 2019-11-27 ENCOUNTER — Telehealth: Payer: Self-pay | Admitting: Nurse Practitioner

## 2019-11-27 LAB — HEPATITIS C ANTIBODY
Hepatitis C Ab: NONREACTIVE
SIGNAL TO CUT-OFF: 0.09 (ref <1.00)

## 2019-11-27 LAB — HEPATITIS B SURFACE ANTIBODY,QUALITATIVE: Hep B S Ab: NONREACTIVE

## 2019-11-27 LAB — HEPATITIS B CORE ANTIBODY, TOTAL: Hep B Core Total Ab: NONREACTIVE

## 2019-11-27 LAB — HEPATITIS B SURFACE ANTIGEN: Hepatitis B Surface Ag: NONREACTIVE

## 2019-11-27 NOTE — Telephone Encounter (Signed)
Called and spoke with patient-patient reports he had not gone to the bathroom for a few days including over the weekend-he took a double dose of Metamucil and is not having abd cramping and had bloating until this morning when he "expelled 5 times already today and I am feeling a little better"- patient reports he always drinks at least 64 oz of water everyday;   Denies fever/chills/nausea/vomiting/SHOB/distress  Patient is wanting to know if Metamucil can cause bloating and abd soreness-or the constipation is what has caused the abd soreness and cramping  Patient is requesting to know if "the lab work that the doctor Wanting to know what the "saturation ratios"- means for him?  Patient is wanting to know if there is anything he can take for the abdominal cramping-"I think the abdominal cramping is from the Metamucil"-  Please advise

## 2019-11-27 NOTE — Telephone Encounter (Signed)
Pt has some questions about his lab results and is also dealing with some stomach issues. Pls call him.

## 2019-11-28 NOTE — Telephone Encounter (Signed)
Called and spoke with patient-patient informed of provider's recommendations; patient is agreeable with plan of care; patient reports he is "back to normal now that I know that Metamucil caused this-I am feeling much better"; Patient verbalized understanding of information/instructions;  Patient was advised to call the office at [phone] if questions/concerns arise;

## 2019-11-28 NOTE — Telephone Encounter (Signed)
Pls call the patient, he should top taking Metamucil. I would advised he take only Miralax 1 capful mixed in 8 ounces of water at bed time for constipation. He cant take Ibgard 1 po bid PRN for abd cramping purchased otc. thx

## 2019-12-26 ENCOUNTER — Telehealth: Payer: Self-pay | Admitting: Nurse Practitioner

## 2019-12-26 NOTE — Telephone Encounter (Signed)
Called and spoke with patient- patient reports he is having some stomach cramping (sides), LUQ pain comes/goes, tried IBGard without relief, not having any solid bowel movements, started on 12/22/2019 with a bland diet/non-spicy diet-not really feeling well, not having diarrhea either, not having a bloated feeling in stomach/not having pain but just needing the "looser bowel movements to improve"; patient is watching what he is eating due to his hx of diverticulitis-has a "lot of stress in my life right now"; what can be done to help with BM's?  Patient is requesting to know if the mesh that was placed when his hernia was operated on could be causing his discomfort and issues  Please advise

## 2019-12-27 NOTE — Telephone Encounter (Signed)
Bre, pls verify if he is still taking a fiber supplement. Sometimes the fiber might be irritating and sometimes it can help. If he is having worse loose stools on fiber then stop it. He should take Florastor probiotic one po bid x 4 weeks if affordable if not then any bacteria probiotic once daily ie: Phillip's bacteria probiotic. If he is having only loose stool then pls order GI pathogen panel. Pt to call office if sx persist or worsen. thx

## 2019-12-28 NOTE — Telephone Encounter (Signed)
Called and spoke with patient-patient informed of provider's recommendations; patient reports he has "gotten back on the Metamucil because it has helped me before-the IBGard did not help at all-and I was not able to take my Magnesium while I was out of town nor was I able to take the Metamucil-I am feeling better than I have been in a while and my stool is more formed now that I have started back on the Metamucil"; Patient verbalized understanding of information/instructions;  Patient was advised to call the office at [phone] if questions/concerns arise;

## 2019-12-28 NOTE — Telephone Encounter (Signed)
Patient returning your call.

## 2019-12-28 NOTE — Telephone Encounter (Signed)
Left message for patient to call back to the office;  

## 2020-06-03 ENCOUNTER — Encounter (HOSPITAL_BASED_OUTPATIENT_CLINIC_OR_DEPARTMENT_OTHER): Payer: Self-pay | Admitting: *Deleted

## 2020-06-03 ENCOUNTER — Emergency Department (HOSPITAL_BASED_OUTPATIENT_CLINIC_OR_DEPARTMENT_OTHER): Payer: No Typology Code available for payment source

## 2020-06-03 ENCOUNTER — Other Ambulatory Visit: Payer: Self-pay

## 2020-06-03 ENCOUNTER — Inpatient Hospital Stay (HOSPITAL_BASED_OUTPATIENT_CLINIC_OR_DEPARTMENT_OTHER)
Admission: EM | Admit: 2020-06-03 | Discharge: 2020-06-06 | DRG: 871 | Disposition: A | Discharge status: Home / Self Care | Payer: No Typology Code available for payment source | Attending: Internal Medicine | Admitting: Internal Medicine

## 2020-06-03 DIAGNOSIS — J189 Pneumonia, unspecified organism: Secondary | ICD-10-CM | POA: Diagnosis not present

## 2020-06-03 DIAGNOSIS — Z882 Allergy status to sulfonamides status: Secondary | ICD-10-CM

## 2020-06-03 DIAGNOSIS — E1165 Type 2 diabetes mellitus with hyperglycemia: Secondary | ICD-10-CM | POA: Diagnosis not present

## 2020-06-03 DIAGNOSIS — F17200 Nicotine dependence, unspecified, uncomplicated: Secondary | ICD-10-CM | POA: Diagnosis present

## 2020-06-03 DIAGNOSIS — I714 Abdominal aortic aneurysm, without rupture, unspecified: Secondary | ICD-10-CM | POA: Diagnosis present

## 2020-06-03 DIAGNOSIS — R509 Fever, unspecified: Secondary | ICD-10-CM

## 2020-06-03 DIAGNOSIS — R0902 Hypoxemia: Secondary | ICD-10-CM | POA: Diagnosis not present

## 2020-06-03 DIAGNOSIS — Z7982 Long term (current) use of aspirin: Secondary | ICD-10-CM | POA: Diagnosis not present

## 2020-06-03 DIAGNOSIS — R161 Splenomegaly, not elsewhere classified: Secondary | ICD-10-CM | POA: Diagnosis present

## 2020-06-03 DIAGNOSIS — Z23 Encounter for immunization: Secondary | ICD-10-CM

## 2020-06-03 DIAGNOSIS — Z8551 Personal history of malignant neoplasm of bladder: Secondary | ICD-10-CM

## 2020-06-03 DIAGNOSIS — N3000 Acute cystitis without hematuria: Secondary | ICD-10-CM

## 2020-06-03 DIAGNOSIS — K760 Fatty (change of) liver, not elsewhere classified: Secondary | ICD-10-CM | POA: Diagnosis not present

## 2020-06-03 DIAGNOSIS — E871 Hypo-osmolality and hyponatremia: Secondary | ICD-10-CM | POA: Diagnosis present

## 2020-06-03 DIAGNOSIS — Z20822 Contact with and (suspected) exposure to covid-19: Secondary | ICD-10-CM | POA: Diagnosis present

## 2020-06-03 DIAGNOSIS — Z79899 Other long term (current) drug therapy: Secondary | ICD-10-CM | POA: Diagnosis not present

## 2020-06-03 DIAGNOSIS — C679 Malignant neoplasm of bladder, unspecified: Secondary | ICD-10-CM | POA: Diagnosis present

## 2020-06-03 DIAGNOSIS — A419 Sepsis, unspecified organism: Secondary | ICD-10-CM

## 2020-06-03 DIAGNOSIS — Z936 Other artificial openings of urinary tract status: Secondary | ICD-10-CM

## 2020-06-03 DIAGNOSIS — Z7984 Long term (current) use of oral hypoglycemic drugs: Secondary | ICD-10-CM

## 2020-06-03 DIAGNOSIS — A4159 Other Gram-negative sepsis: Principal | ICD-10-CM | POA: Diagnosis present

## 2020-06-03 DIAGNOSIS — I10 Essential (primary) hypertension: Secondary | ICD-10-CM | POA: Diagnosis not present

## 2020-06-03 DIAGNOSIS — Z881 Allergy status to other antibiotic agents status: Secondary | ICD-10-CM

## 2020-06-03 DIAGNOSIS — N390 Urinary tract infection, site not specified: Secondary | ICD-10-CM | POA: Diagnosis present

## 2020-06-03 DIAGNOSIS — E119 Type 2 diabetes mellitus without complications: Secondary | ICD-10-CM

## 2020-06-03 LAB — CBC WITH DIFFERENTIAL/PLATELET
Abs Immature Granulocytes: 0.13 10*3/uL — ABNORMAL HIGH (ref 0.00–0.07)
Basophils Absolute: 0.1 10*3/uL (ref 0.0–0.1)
Basophils Relative: 0 %
Eosinophils Absolute: 0 10*3/uL (ref 0.0–0.5)
Eosinophils Relative: 0 %
HCT: 42.1 % (ref 39.0–52.0)
Hemoglobin: 14.8 g/dL (ref 13.0–17.0)
Immature Granulocytes: 1 %
Lymphocytes Relative: 8 %
Lymphs Abs: 1 10*3/uL (ref 0.7–4.0)
MCH: 28.9 pg (ref 26.0–34.0)
MCHC: 35.2 g/dL (ref 30.0–36.0)
MCV: 82.2 fL (ref 80.0–100.0)
Monocytes Absolute: 1.3 10*3/uL — ABNORMAL HIGH (ref 0.1–1.0)
Monocytes Relative: 11 %
Neutro Abs: 9.3 10*3/uL — ABNORMAL HIGH (ref 1.7–7.7)
Neutrophils Relative %: 80 %
Platelets: 316 10*3/uL (ref 150–400)
RBC: 5.12 MIL/uL (ref 4.22–5.81)
RDW: 12.6 % (ref 11.5–15.5)
WBC: 11.7 10*3/uL — ABNORMAL HIGH (ref 4.0–10.5)
nRBC: 0 % (ref 0.0–0.2)

## 2020-06-03 LAB — URINALYSIS, MICROSCOPIC (REFLEX)

## 2020-06-03 LAB — COMPREHENSIVE METABOLIC PANEL
ALT: 36 U/L (ref 0–44)
AST: 29 U/L (ref 15–41)
Albumin: 3.7 g/dL (ref 3.5–5.0)
Alkaline Phosphatase: 71 U/L (ref 38–126)
Anion gap: 12 (ref 5–15)
BUN: 12 mg/dL (ref 8–23)
CO2: 21 mmol/L — ABNORMAL LOW (ref 22–32)
Calcium: 9.7 mg/dL (ref 8.9–10.3)
Chloride: 93 mmol/L — ABNORMAL LOW (ref 98–111)
Creatinine, Ser: 0.92 mg/dL (ref 0.61–1.24)
GFR calc Af Amer: >60 mL/min (ref >60)
GFR calc non Af Amer: >60 mL/min (ref >60)
Glucose, Bld: 422 mg/dL — ABNORMAL HIGH (ref 70–99)
Potassium: 4 mmol/L (ref 3.5–5.1)
Sodium: 126 mmol/L — ABNORMAL LOW (ref 135–145)
Total Bilirubin: 1.4 mg/dL — ABNORMAL HIGH (ref 0.3–1.2)
Total Protein: 7.8 g/dL (ref 6.5–8.1)

## 2020-06-03 LAB — URINALYSIS, ROUTINE W REFLEX MICROSCOPIC
Bilirubin Urine: NEGATIVE
Glucose, UA: >=500 mg/dL — AB
Ketones, ur: NEGATIVE mg/dL
Leukocytes,Ua: NEGATIVE
Nitrite: NEGATIVE
Protein, ur: 100 mg/dL — AB
Specific Gravity, Urine: <1.005 — ABNORMAL LOW (ref 1.005–1.030)
pH: 7 (ref 5.0–8.0)

## 2020-06-03 LAB — SARS CORONAVIRUS 2 BY RT PCR (HOSPITAL ORDER, PERFORMED IN ~~LOC~~ HOSPITAL LAB): SARS Coronavirus 2: NEGATIVE

## 2020-06-03 LAB — PROTIME-INR
INR: 1.1 (ref 0.8–1.2)
Prothrombin Time: 13.5 seconds (ref 11.4–15.2)

## 2020-06-03 LAB — LACTIC ACID, PLASMA
Lactic Acid, Venous: 2 mmol/L (ref 0.5–1.9)
Lactic Acid, Venous: 2.7 mmol/L (ref 0.5–1.9)

## 2020-06-03 MED ORDER — INSULIN ASPART 100 UNIT/ML ~~LOC~~ SOLN: 0.0000 [IU] | Freq: Three times a day (TID) | SUBCUTANEOUS | Status: DC

## 2020-06-03 MED ORDER — AMLODIPINE BESYLATE 10 MG PO TABS: 10.0000 mg | ORAL_TABLET | Freq: Every day | ORAL | Status: DC

## 2020-06-03 MED ORDER — SODIUM CHLORIDE 0.9 % IV SOLN
1.0000 g | Freq: Once | INTRAVENOUS | Status: AC
  Administered 2020-06-03: 1 g via INTRAVENOUS
  Filled 2020-06-03: qty 10

## 2020-06-03 MED ORDER — ONDANSETRON HCL 4 MG/2ML IJ SOLN: 4.0000 mg | Freq: Once | INTRAMUSCULAR | Status: AC

## 2020-06-03 MED ORDER — METFORMIN HCL 500 MG PO TABS
1000.0000 mg | ORAL_TABLET | Freq: Two times a day (BID) | ORAL | Status: DC
  Filled 2020-06-03: qty 2

## 2020-06-03 MED ORDER — ACETAMINOPHEN 325 MG PO TABS
650.0000 mg | ORAL_TABLET | Freq: Once | ORAL | Status: AC
  Administered 2020-06-03: 650 mg via ORAL

## 2020-06-03 MED ORDER — SODIUM CHLORIDE 0.9 % IV BOLUS
500.0000 mL | Freq: Once | INTRAVENOUS | Status: AC
  Administered 2020-06-03: 500 mL via INTRAVENOUS

## 2020-06-03 MED ORDER — ONDANSETRON HCL 4 MG/2ML IJ SOLN
INTRAMUSCULAR | Status: AC
  Administered 2020-06-03: 4 mg via INTRAVENOUS
  Filled 2020-06-03: qty 2

## 2020-06-03 MED ORDER — ACETAMINOPHEN 325 MG PO TABS
650.0000 mg | ORAL_TABLET | Freq: Once | ORAL | Status: DC
  Filled 2020-06-03: qty 2

## 2020-06-03 NOTE — ED Notes (Signed)
Pt. Had an episode of vomiting .  RN and EMT cleaned Pt. And EMT cleaned Pt. And changed bed.  EDP re evaluated the Pt. As well.

## 2020-06-03 NOTE — ED Notes (Signed)
Pt. Reports feeling no chest pain and no shortness of breath.  Pt. Is being evaluated by Resp.

## 2020-06-03 NOTE — Progress Notes (Signed)
Patient's SPO2 is 88% on RA and patient is tachycardic and tachypneic.  Place patient on 3 liter nasal cannula.  Patient's SPO2 increased to 93% and RR decreased.  RT will continue to monitor.

## 2020-06-03 NOTE — ED Triage Notes (Signed)
Fever for a week. He had a negative Covid test 3 days ago. He has a ileostomy due to bladder cancer.

## 2020-06-03 NOTE — ED Notes (Signed)
Date and time results received: 06/03/20 2127  Test: lactic acid Critical Value: 2.7  Name of Provider Notified: Dr. Belfi, [place] PA  Orders Received? Or Actions Taken?: no new orders

## 2020-06-03 NOTE — ED Notes (Signed)
Patient denies pain and is resting comfortably.  

## 2020-06-03 NOTE — ED Provider Notes (Signed)
[facility] EMERGENCY DEPARTMENT Provider Note   CSN: [id number] Arrival date & time: 06/03/20  1720     History Chief Complaint  Patient presents with  . Fever    Shaun Pittman is a 64 y.o. male.  Patient is a 65 year old male who presents with a fever.  He has a history of diabetes, hypertension and prior bladder cancer status post cystoprostatectomy with an ilial conduit placed in November 2015.  He said he has felt bad for about a week with intermittent fevers.  They had started low-grade at about 99-100 but today it was higher to 102 when he was feeling worse.  He feels generally weak and has had some sweatiness and occasional chills.  He has a mild cough but he says is typical for him due to postnasal drip.  He has a little redness on his right big toe but it has not been getting any bigger or extending into his foot.  He had some pain in his right flank yesterday but denies any today.  His urine has been darker than normal but otherwise not discolored.  He has no abdominal pain.  No nausea or vomiting.  He does have some loose stools due to Metformin but no change in this.  He said he was tested for Covid 3 days ago and it was negative.  This was done by the [facility] hospital.        Past Medical History:  Diagnosis Date  . AAA (abdominal aortic aneurysm) ([place])   . Cancer ([place])   . Diabetes mellitus without complication ([place])   . Hypertension     Patient Active Problem List   Diagnosis Date Noted  . UTI (urinary tract infection) 06/03/2020  . Splenic lesion 11/18/2019    Past Surgical History:  Procedure Laterality Date  . BLADDER SURGERY    . HERNIA REPAIR    . ILEOSTOMY         No family history on file.  Social History   Tobacco Use  . Smoking status: Current Every Day Smoker  . Smokeless tobacco: Never Used  Substance Use Topics  . Alcohol use: Never  . Drug use: Never    Home Medications Prior to Admission medications   Medication Sig Start  Date End Date Taking? Authorizing Provider  amLODipine (NORVASC) 10 MG tablet Take 10 mg by mouth daily.   Yes [provider]  cholecalciferol (VITAMIN D3) 25 MCG (1000 UNIT) tablet Take 1,000 Units by mouth daily.   Yes [provider]  magnesium 30 MG tablet Take 30 mg by mouth daily.   Yes [provider]  metFORMIN (GLUCOPHAGE) 1000 MG tablet Take 1,000 mg by mouth 2 (two) times daily with a meal.   Yes [provider]  Multiple Vitamin (MULTIVITAMIN) tablet Take 1 tablet by mouth daily.   Yes [provider]  Omega-3 Fatty Acids (FISH OIL) 1200 MG CAPS Take 1 capsule by mouth daily.   Yes [provider]  aspirin 81 MG EC tablet Take 81 mg by mouth daily. Swallow whole.    [provider]    Allergies    Sulfa antibiotics and Zithromax [azithromycin]  Review of Systems   Review of Systems  Constitutional: Positive for activity change, appetite change, chills, diaphoresis, fatigue and fever.  HENT: Negative for congestion, rhinorrhea and sneezing.   Eyes: Negative.   Respiratory: Positive for cough. Negative for chest tightness and shortness of breath.   Cardiovascular: Negative for chest pain  and leg swelling.  Gastrointestinal: Negative for abdominal pain, blood in stool, diarrhea, nausea and vomiting.  Genitourinary: Positive for flank pain. Negative for difficulty urinating, frequency and hematuria.  Musculoskeletal: Negative for arthralgias and back pain.  Skin: Negative for rash.  Neurological: Negative for dizziness, speech difficulty, weakness, numbness and headaches.    Physical Exam Updated Vital Signs BP (!) 155/84   Pulse (!) 117   Temp (!) 101 F (38.3 C) (Oral)   Resp (!) 26   Ht 6' (1.829 m)   Wt 79.4 kg   SpO2 95%   BMI 23.73 kg/m   Physical Exam Constitutional:      Appearance: He is well-developed.  HENT:     Head: Normocephalic and atraumatic.  Eyes:     Pupils: Pupils are equal,  round, and reactive to light.  Cardiovascular:     Rate and Rhythm: Normal rate and regular rhythm.     Heart sounds: Normal heart sounds.  Pulmonary:     Effort: Pulmonary effort is normal. No respiratory distress.     Breath sounds: Normal breath sounds. No wheezing or rales.  Chest:     Chest wall: No tenderness.  Abdominal:     General: Bowel sounds are normal.     Palpations: Abdomen is soft.     Tenderness: There is no abdominal tenderness. There is no guarding or rebound.  Musculoskeletal:        General: Normal range of motion.     Cervical back: Normal range of motion and neck supple.     Comments: .  He small area of redness to the lateral aspect of the right big toe.  It does not extend around the toe.  There is no significant swelling.  No fluctuance or induration.  No extension to the foot.  No swelling or tenderness to the calves.  Lymphadenopathy:     Cervical: No cervical adenopathy.  Skin:    General: Skin is warm and dry.     Findings: No rash.  Neurological:     Mental Status: He is alert and oriented to person, place, and time.     ED Results / Procedures / Treatments   Labs (all labs ordered are listed, but only abnormal results are displayed) Labs Reviewed  COMPREHENSIVE METABOLIC PANEL - Abnormal; Notable for the following components:      Result Value   Sodium 126 (*)    Chloride 93 (*)    CO2 21 (*)    Glucose, Bld 422 (*)    Total Bilirubin 1.4 (*)    All other components within normal limits  LACTIC ACID, PLASMA - Abnormal; Notable for the following components:   Lactic Acid, Venous 2.0 (*)    All other components within normal limits  LACTIC ACID, PLASMA - Abnormal; Notable for the following components:   Lactic Acid, Venous 2.7 (*)    All other components within normal limits  CBC WITH DIFFERENTIAL/PLATELET - Abnormal; Notable for the following components:   WBC 11.7 (*)    Neutro Abs 9.3 (*)    Monocytes Absolute 1.3 (*)    Abs Immature  Granulocytes 0.13 (*)    All other components within normal limits  URINALYSIS, ROUTINE W REFLEX MICROSCOPIC - Abnormal; Notable for the following components:   APPearance HAZY (*)    Specific Gravity, Urine <1.005 (*)    Glucose, UA >=500 (*)    Hgb urine dipstick MODERATE (*)    Protein, ur 100 (*)  All other components within normal limits  URINALYSIS, MICROSCOPIC (REFLEX) - Abnormal; Notable for the following components:   Bacteria, UA MANY (*)    All other components within normal limits  SARS CORONAVIRUS 2 BY RT PCR (HOSPITAL ORDER, [facility])  CULTURE, BLOOD (ROUTINE X 2)  CULTURE, BLOOD (ROUTINE X 2)  URINE CULTURE  PROTIME-INR    EKG None  Radiology DG Chest Port 1 View  Result Date: 06/03/2020 CLINICAL DATA:  Possible aspiration after vomiting. EXAM: PORTABLE CHEST 1 VIEW COMPARISON:  June 03, 2020 (6:05 p.m.) FINDINGS: Mild, diffuse chronic appearing increased interstitial lung markings are seen. This is slightly more prominent within the bilateral lung bases and is unchanged in appearance when compared to the prior study. There is no evidence of acute infiltrate, pleural effusion or pneumothorax. The heart size and mediastinal contours are within normal limits. Multilevel degenerative changes are seen throughout the thoracic spine. IMPRESSION: Stable exam, without evidence of acute or active cardiopulmonary disease. Electronically Signed   By: Virgina Norfolk M.D.   On: 06/03/2020 21:39   DG Chest Port 1 View  Result Date: 06/03/2020 CLINICAL DATA:  Fever. EXAM: PORTABLE CHEST 1 VIEW COMPARISON:  Jan 31, 2018 FINDINGS: Mild chronic appearing increased interstitial lung markings are noted. This is more prominent within the bilateral lung bases. There is no evidence of a pleural effusion or pneumothorax. The heart size and mediastinal contours are within normal limits. Multilevel degenerative changes seen throughout the thoracic spine.  IMPRESSION: Chronic appearing increased interstitial lung markings, without acute or active cardiopulmonary disease. Electronically Signed   By: Virgina Norfolk M.D.   On: 06/03/2020 18:40   CT Renal Stone Study  Result Date: 06/03/2020 CLINICAL DATA:  Fever and flank pain EXAM: CT ABDOMEN AND PELVIS WITHOUT CONTRAST TECHNIQUE: Multidetector CT imaging of the abdomen and pelvis was performed following the standard protocol without IV contrast. COMPARISON:  None. FINDINGS: Lower chest: The lung bases are clear. The heart size is normal. Hepatobiliary: There is decreased hepatic attenuation suggestive of hepatic steatosis. Status post cholecystectomy.There is no biliary ductal dilation. Pancreas: Normal contours without ductal dilatation. No peripancreatic fluid collection. Spleen: Spleen is enlarged measuring 15 cm craniocaudad. Adrenals/Urinary Tract: --Adrenal glands: Unremarkable. --Right kidney/ureter: There is mild right-sided collecting system dilatation. There is a hyperdense 1.4 cm nodule in the interpolar region of the right kidney. --Left kidney/ureter: There is mild left-sided collecting system dilatation. There is an ileal conduit with a urostomy in the right lower quadrant. --Urinary bladder: Surgically absent Stomach/Bowel: --Stomach/Duodenum: No hiatal hernia or other gastric abnormality. Normal duodenal course and caliber. --Small bowel: There is a peristomal hernia in the right lower quadrant containing a loop of small bowel. There is no evidence for a small bowel obstruction. --Colon: There is scattered colonic diverticula without CT evidence for diverticulitis. --Appendix: Normal. Vascular/Lymphatic: There are atherosclerotic changes of the abdominal aorta with an infrarenal abdominal aortic aneurysm measuring approximately 3.4 cm. The bilateral common iliac arteries are ectatic measuring up to approximately 1.7 cm each. --No retroperitoneal lymphadenopathy. --No mesenteric lymphadenopathy.  --No pelvic or inguinal lymphadenopathy. Reproductive: The patient is status post prostatectomy. Other: No ascites or free air. The abdominal wall is normal. Musculoskeletal. No acute displaced fractures. IMPRESSION: 1. Mild bilateral collecting system dilatation without evidence for obstructing stone. The patient is status post cystoprostatectomy with ileal conduit formation. 2. There is a peristomal hernia in the right lower quadrant containing a loop of small bowel. There is no evidence  for a small bowel obstruction. 3. There is a 1.4 cm hyperdense nodule in the interpolar region of the right kidney. A follow-up nonemergent outpatient renal ultrasound is recommended. 4. Hepatic steatosis. 5. Splenomegaly. Aortic Atherosclerosis (ICD10-I70.0). Electronically Signed   By: Constance Holster M.D.   On: 06/03/2020 19:14    Procedures Procedures (including critical care time)  Medications Ordered in ED Medications  acetaminophen (TYLENOL) tablet 650 mg (has no administration in time range)  sodium chloride 0.9 % bolus 500 mL (0 mLs Intravenous Stopped 06/03/20 2012)  cefTRIAXone (ROCEPHIN) 1 g in sodium chloride 0.9 % 100 mL IVPB (0 g Intravenous Stopped 06/03/20 2012)  sodium chloride 0.9 % bolus 500 mL (500 mLs Intravenous New Bag/Given 06/03/20 2137)  ondansetron (ZOFRAN) injection 4 mg (4 mg Intravenous Given 06/03/20 2053)    ED Course  I have reviewed the triage vital signs and the nursing notes.  Pertinent labs & imaging results that were available during my care of the patient were reviewed by me and considered in my medical decision making (see chart for details).    MDM Rules/Calculators/A&P                          Patient is a 64 year old male who presents with fever and fatigue with chills.  Symptoms been going on for about a week but getting worse over the last couple days.  His Covid test is negative.  He doesn't have a significant cough.  His chest x-ray is clear without evidence of  pneumonia.  He has no associate abdominal pain although last night he had some flank pain.  His urine does appear to be infected which is the likely source of infection.  There is also some RBCs.  Given that any associated flank pain last night, I did do a CT scan to assess for kidney stone.  There is no evidence of kidney stone.  There is some mild dilatation of the collecting system but no obstruction.  There is a hernia in the peristomal region without evidence of obstruction.  Patient says that he is aware of her hernia in this area.  He doesn't have any tenderness.  He has no other current abdominal pain.  I don't see any other source of infection.  There is some mild erythema to his toe but it doesn't really look cellulitic and its a very small area I don't feel will be the source of his symptoms.  No other evidence of cellulitis is identified.  His labs show a mildly elevated white count.  There is also a mild elevation in his lactate.  His second lactate was a little bit higher than the first.  He was given 1 L of IV fluids and started on Rocephin for antibiotic coverage.  He did have an episode of vomiting as his temperature was climbing.  This occurred while he was sleeping.  He woke up and felt like he was choking and had an episode of vomiting.  Following that he was mildly hypoxic with oxygen saturations of 88 to 89%.  He doesn't currently report any shortness of breath but was placed on nasal cannula 2 L.  Repeat chest x-ray doesn't show any obvious evidence of aspiration.  I spoke with Dr. Marlowe Sax with the hospitalist service who has accepted the patient for admission. Final Clinical Impression(s) / ED Diagnoses Final diagnoses:  Acute cystitis without hematuria  Sepsis, due to unspecified organism, unspecified whether acute organ dysfunction  present [facility])    Rx / [place] Orders ED Discharge Orders    None       Malvin Johns, MD 06/03/20 2146

## 2020-06-04 ENCOUNTER — Encounter (HOSPITAL_COMMUNITY): Payer: Self-pay | Admitting: Internal Medicine

## 2020-06-04 ENCOUNTER — Observation Stay (HOSPITAL_COMMUNITY): Payer: No Typology Code available for payment source

## 2020-06-04 ENCOUNTER — Other Ambulatory Visit: Payer: Self-pay

## 2020-06-04 DIAGNOSIS — N390 Urinary tract infection, site not specified: Secondary | ICD-10-CM | POA: Diagnosis present

## 2020-06-04 DIAGNOSIS — Z7984 Long term (current) use of oral hypoglycemic drugs: Secondary | ICD-10-CM | POA: Diagnosis not present

## 2020-06-04 DIAGNOSIS — R0902 Hypoxemia: Secondary | ICD-10-CM | POA: Diagnosis present

## 2020-06-04 DIAGNOSIS — E119 Type 2 diabetes mellitus without complications: Secondary | ICD-10-CM | POA: Diagnosis not present

## 2020-06-04 DIAGNOSIS — C679 Malignant neoplasm of bladder, unspecified: Secondary | ICD-10-CM | POA: Diagnosis present

## 2020-06-04 DIAGNOSIS — Z7982 Long term (current) use of aspirin: Secondary | ICD-10-CM | POA: Diagnosis not present

## 2020-06-04 DIAGNOSIS — A419 Sepsis, unspecified organism: Secondary | ICD-10-CM | POA: Diagnosis not present

## 2020-06-04 DIAGNOSIS — Z936 Other artificial openings of urinary tract status: Secondary | ICD-10-CM | POA: Diagnosis not present

## 2020-06-04 DIAGNOSIS — R5081 Fever presenting with conditions classified elsewhere: Secondary | ICD-10-CM | POA: Diagnosis not present

## 2020-06-04 DIAGNOSIS — I714 Abdominal aortic aneurysm, without rupture, unspecified: Secondary | ICD-10-CM | POA: Diagnosis present

## 2020-06-04 DIAGNOSIS — R161 Splenomegaly, not elsewhere classified: Secondary | ICD-10-CM | POA: Diagnosis present

## 2020-06-04 DIAGNOSIS — Z882 Allergy status to sulfonamides status: Secondary | ICD-10-CM | POA: Diagnosis not present

## 2020-06-04 DIAGNOSIS — I10 Essential (primary) hypertension: Secondary | ICD-10-CM | POA: Diagnosis present

## 2020-06-04 DIAGNOSIS — N3000 Acute cystitis without hematuria: Secondary | ICD-10-CM | POA: Diagnosis not present

## 2020-06-04 DIAGNOSIS — K760 Fatty (change of) liver, not elsewhere classified: Secondary | ICD-10-CM | POA: Diagnosis present

## 2020-06-04 DIAGNOSIS — Z23 Encounter for immunization: Secondary | ICD-10-CM | POA: Diagnosis not present

## 2020-06-04 DIAGNOSIS — Z881 Allergy status to other antibiotic agents status: Secondary | ICD-10-CM | POA: Diagnosis not present

## 2020-06-04 DIAGNOSIS — A4159 Other Gram-negative sepsis: Secondary | ICD-10-CM | POA: Diagnosis present

## 2020-06-04 DIAGNOSIS — E871 Hypo-osmolality and hyponatremia: Secondary | ICD-10-CM | POA: Diagnosis present

## 2020-06-04 DIAGNOSIS — Z79899 Other long term (current) drug therapy: Secondary | ICD-10-CM | POA: Diagnosis not present

## 2020-06-04 DIAGNOSIS — E1165 Type 2 diabetes mellitus with hyperglycemia: Secondary | ICD-10-CM | POA: Diagnosis not present

## 2020-06-04 DIAGNOSIS — R509 Fever, unspecified: Secondary | ICD-10-CM | POA: Diagnosis present

## 2020-06-04 DIAGNOSIS — Z8551 Personal history of malignant neoplasm of bladder: Secondary | ICD-10-CM | POA: Diagnosis not present

## 2020-06-04 DIAGNOSIS — J189 Pneumonia, unspecified organism: Secondary | ICD-10-CM | POA: Diagnosis present

## 2020-06-04 DIAGNOSIS — F17200 Nicotine dependence, unspecified, uncomplicated: Secondary | ICD-10-CM | POA: Diagnosis present

## 2020-06-04 DIAGNOSIS — Z20822 Contact with and (suspected) exposure to covid-19: Secondary | ICD-10-CM | POA: Diagnosis present

## 2020-06-04 LAB — COMPREHENSIVE METABOLIC PANEL
ALT: 37 U/L (ref 0–44)
AST: 32 U/L (ref 15–41)
Albumin: 3.2 g/dL — ABNORMAL LOW (ref 3.5–5.0)
Alkaline Phosphatase: 66 U/L (ref 38–126)
Anion gap: 10 (ref 5–15)
BUN: 11 mg/dL (ref 8–23)
CO2: 24 mmol/L (ref 22–32)
Calcium: 9.3 mg/dL (ref 8.9–10.3)
Chloride: 99 mmol/L (ref 98–111)
Creatinine, Ser: 0.9 mg/dL (ref 0.61–1.24)
GFR calc Af Amer: >60 mL/min (ref >60)
GFR calc non Af Amer: >60 mL/min (ref >60)
Glucose, Bld: 342 mg/dL — ABNORMAL HIGH (ref 70–99)
Potassium: 4.1 mmol/L (ref 3.5–5.1)
Sodium: 133 mmol/L — ABNORMAL LOW (ref 135–145)
Total Bilirubin: 1 mg/dL (ref 0.3–1.2)
Total Protein: 7.1 g/dL (ref 6.5–8.1)

## 2020-06-04 LAB — BLOOD CULTURE ID PANEL (REFLEXED) - BCID2

## 2020-06-04 LAB — RESPIRATORY PANEL BY PCR

## 2020-06-04 LAB — CBC WITH DIFFERENTIAL/PLATELET
Abs Immature Granulocytes: 0.07 10*3/uL (ref 0.00–0.07)
Basophils Absolute: 0.1 10*3/uL (ref 0.0–0.1)
Basophils Relative: 1 %
Eosinophils Absolute: 0 10*3/uL (ref 0.0–0.5)
Eosinophils Relative: 0 %
HCT: 41.6 % (ref 39.0–52.0)
Hemoglobin: 13.8 g/dL (ref 13.0–17.0)
Immature Granulocytes: 1 %
Lymphocytes Relative: 12 %
Lymphs Abs: 1 10*3/uL (ref 0.7–4.0)
MCH: 28 pg (ref 26.0–34.0)
MCHC: 33.2 g/dL (ref 30.0–36.0)
MCV: 84.6 fL (ref 80.0–100.0)
Monocytes Absolute: 1.1 10*3/uL — ABNORMAL HIGH (ref 0.1–1.0)
Monocytes Relative: 13 %
Neutro Abs: 6.2 10*3/uL (ref 1.7–7.7)
Neutrophils Relative %: 73 %
Platelets: 281 10*3/uL (ref 150–400)
RBC: 4.92 MIL/uL (ref 4.22–5.81)
RDW: 12.8 % (ref 11.5–15.5)
WBC: 8.4 10*3/uL (ref 4.0–10.5)
nRBC: 0 % (ref 0.0–0.2)

## 2020-06-04 LAB — URINE CULTURE

## 2020-06-04 LAB — GLUCOSE, CAPILLARY
Glucose-Capillary: 341 mg/dL — ABNORMAL HIGH (ref 70–99)
Glucose-Capillary: 380 mg/dL — ABNORMAL HIGH (ref 70–99)
Glucose-Capillary: 299 mg/dL — ABNORMAL HIGH (ref 70–99)
Glucose-Capillary: 306 mg/dL — ABNORMAL HIGH (ref 70–99)

## 2020-06-04 LAB — HEMOGLOBIN A1C
Hgb A1c MFr Bld: 9.1 % — ABNORMAL HIGH (ref 4.8–5.6)
Mean Plasma Glucose: 214.47 mg/dL

## 2020-06-04 LAB — LACTIC ACID, PLASMA
Lactic Acid, Venous: 1.8 mmol/L (ref 0.5–1.9)
Lactic Acid, Venous: 1.6 mmol/L (ref 0.5–1.9)

## 2020-06-04 LAB — PROCALCITONIN: Procalcitonin: 1.55 ng/mL

## 2020-06-04 LAB — HIV ANTIBODY (ROUTINE TESTING W REFLEX): HIV Screen 4th Generation wRfx: NONREACTIVE

## 2020-06-04 MED ORDER — ACETAMINOPHEN 650 MG RE SUPP: 650.0000 mg | Freq: Four times a day (QID) | RECTAL | Status: DC | PRN

## 2020-06-04 MED ORDER — INSULIN DETEMIR 100 UNIT/ML ~~LOC~~ SOLN
8.0000 [IU] | Freq: Every day | SUBCUTANEOUS | Status: DC
  Administered 2020-06-04: 8 [IU] via SUBCUTANEOUS
  Filled 2020-06-04 (×2): qty 0.08

## 2020-06-04 MED ORDER — ONDANSETRON HCL 4 MG PO TABS: 4.0000 mg | ORAL_TABLET | Freq: Four times a day (QID) | ORAL | Status: DC | PRN

## 2020-06-04 MED ORDER — INSULIN ASPART 100 UNIT/ML ~~LOC~~ SOLN
0.0000 [IU] | Freq: Three times a day (TID) | SUBCUTANEOUS | Status: DC
  Administered 2020-06-05: 11 [IU] via SUBCUTANEOUS
  Administered 2020-06-06: 5 [IU] via SUBCUTANEOUS
  Administered 2020-06-06: 3 [IU] via SUBCUTANEOUS

## 2020-06-04 MED ORDER — ACETAMINOPHEN 325 MG PO TABS: 650.0000 mg | ORAL_TABLET | Freq: Four times a day (QID) | ORAL | Status: DC | PRN

## 2020-06-04 MED ORDER — ASPIRIN 81 MG PO CHEW
81.0000 mg | CHEWABLE_TABLET | Freq: Every day | ORAL | Status: DC
  Administered 2020-06-04 – 2020-06-06 (×3): 81 mg via ORAL
  Filled 2020-06-04 (×3): qty 1

## 2020-06-04 MED ORDER — SODIUM CHLORIDE 0.9 % IV SOLN
2.0000 g | INTRAVENOUS | Status: DC
  Administered 2020-06-04 – 2020-06-06 (×3): 2 g via INTRAVENOUS
  Filled 2020-06-04: qty 20
  Filled 2020-06-04 (×3): qty 2

## 2020-06-04 MED ORDER — ENOXAPARIN SODIUM 40 MG/0.4ML ~~LOC~~ SOLN
40.0000 mg | SUBCUTANEOUS | Status: DC
  Administered 2020-06-04 – 2020-06-06 (×3): 40 mg via SUBCUTANEOUS
  Filled 2020-06-04 (×3): qty 0.4

## 2020-06-04 MED ORDER — LACTATED RINGERS IV SOLN: INTRAVENOUS | Status: DC

## 2020-06-04 MED ORDER — INSULIN ASPART 100 UNIT/ML ~~LOC~~ SOLN: 0.0000 [IU] | Freq: Every day | SUBCUTANEOUS | Status: DC

## 2020-06-04 MED ORDER — SODIUM CHLORIDE 0.9% FLUSH
3.0000 mL | Freq: Two times a day (BID) | INTRAVENOUS | Status: DC
  Administered 2020-06-04 – 2020-06-06 (×3): 3 mL via INTRAVENOUS

## 2020-06-04 MED ORDER — PNEUMOCOCCAL VAC POLYVALENT 25 MCG/0.5ML IJ INJ
0.5000 mL | INJECTION | INTRAMUSCULAR | Status: AC
  Administered 2020-06-05: 0.5 mL via INTRAMUSCULAR
  Filled 2020-06-04: qty 0.5

## 2020-06-04 MED ORDER — SODIUM CHLORIDE 0.9 % IV SOLN
100.0000 mg | Freq: Two times a day (BID) | INTRAVENOUS | Status: DC
  Administered 2020-06-04 – 2020-06-06 (×4): 100 mg via INTRAVENOUS
  Filled 2020-06-04 (×8): qty 100

## 2020-06-04 MED ORDER — AMLODIPINE BESYLATE 2.5 MG PO TABS: 2.5000 mg | ORAL_TABLET | Freq: Every day | ORAL | Status: DC

## 2020-06-04 MED ORDER — ACETAMINOPHEN 325 MG PO TABS
650.0000 mg | ORAL_TABLET | Freq: Once | ORAL | Status: AC
  Administered 2020-06-04: 650 mg via ORAL
  Filled 2020-06-04: qty 2

## 2020-06-04 MED ORDER — ONDANSETRON HCL 4 MG/2ML IJ SOLN: 4.0000 mg | Freq: Four times a day (QID) | INTRAMUSCULAR | Status: DC | PRN

## 2020-06-04 MED ORDER — AMLODIPINE BESYLATE 2.5 MG PO TABS
2.5000 mg | ORAL_TABLET | Freq: Every day | ORAL | Status: DC
  Administered 2020-06-04 – 2020-06-06 (×3): 2.5 mg via ORAL
  Filled 2020-06-04 (×3): qty 1

## 2020-06-04 NOTE — Progress Notes (Addendum)
CXR with possible progressive lower airspace disease.  Viral panel negative.  Procalcitonin is 1.55.  Labs/imaging in conjunction with symptoms are most suggestive of CAP.  Will start Rocephin and Doxy (has Azithromycin allergy).  Further update: BCID from 1 blood culture growing Klebsiella Pneumoniae.  This is not unexpected, as he was thought to have sepsis from PNA.  Current antibiotic coverage should be appropriate.    Carlyon Shadow, M.D.

## 2020-06-04 NOTE — ED Notes (Signed)
[facility] notified (Tammy) - patient ready for transport 

## 2020-06-04 NOTE — H&P (Signed)
History and Physical    Shaun Pittman ZSW:[id number] DOB: [birth year] DOA: 06/03/2020  PCP: [facility], [facility] Consultants:  [facility] - GI Patient coming from:  Home - lives alone; NOK: Son, daughter  Chief Complaint: Fever  HPI: Shaun Pittman is a 64 y.o. male with medical history significant of HTN; DM; bladder cancer s/p cystoprostatectomy with pelvic LN dissection and ileal conduit (2015); fatty liver; splenic hemangiomas; and AAA presenting to [facility] with fever.  He reports that the beginning of last week he had a bad headache and started running a low grade fever.  Tues-Wed it was better.  Thursday it was worse and he went to the [place] and got COVID tested on Friday (negative).  He felt better that day.  It started again Saturday night - fever, sweating.  Temp 99-100 even with Tylenol.  Yesterday he had temp to 102 and HR was very elevated (up to 125-130).  He is feeling much better today.  No URI symptoms (has vasomotor rhinitis, unchanged) with some cough.  He vomited once yesterday and has throat irritation that he relates to the vomiting.  No urinary discomfort.      ED Course:  [facility] to [facility] transfer, per Dr. Marlowe Sax:  Patient with history of hypertension, diabetes, bladder cancer in 2015 status post cystoprostatectomy with an ileal conduit presenting with complaints of fevers, fatigue, nausea for 1 week. Work-up done in the ED revealed mild leukocytosis and mild lactic acidosis. Tachycardic with heart rate in the 110s. UA with signs of infection. Chest x-ray negative for pneumonia. Covid test negative. 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 Patient was given 1 L of fluids and ceftriaxone. He had an episode of vomiting in the ED and his oxygen saturation dropped to upper 80s soon after. ED physician is ordering a repeat chest x-ray.    Review of Systems: As per HPI; otherwise review of systems reviewed  and negative.   Ambulatory Status:  Ambulates without assistance  COVID Vaccine Status:  None - not interested in receiving the vaccine while here  Past Medical History:  Diagnosis Date  . AAA (abdominal aortic aneurysm) ([place])   . Bladder cancer ([place])    has ileostomy  . Diabetes mellitus without complication ([place])   . Hypertension     Past Surgical History:  Procedure Laterality Date  . BLADDER SURGERY    . HERNIA REPAIR    . ILEOSTOMY      Social History   Socioeconomic History  . Marital status: Divorced    Spouse name: Not on file  . Number of children: Not on file  . Years of education: Not on file  . Highest education level: Not on file  Occupational History  . Occupation: retired  Tobacco Use  . Smoking status: Current Every Day Smoker    Packs/day: 1.00    Years: 40.00    Pack years: 40.00  . Smokeless tobacco: Never Used  . Tobacco comment: down to 1/3-1/2 pack  Substance and Sexual Activity  . Alcohol use: Never  . Drug use: Never  . Sexual activity: Not on file  Other Topics Concern  . Not on file  Social History Narrative  . Not on file   Social Determinants of Health   Financial Resource Strain:   . Difficulty of Paying Living Expenses: Not on file  Food Insecurity:   . Worried About Charity fundraiser in the Last Year:  Not on file  . Ran Out of Food in the Last Year: Not on file  Transportation Needs:   . Lack of Transportation (Medical): Not on file  . Lack of Transportation (Non-Medical): Not on file  Physical Activity:   . Days of Exercise per Week: Not on file  . Minutes of Exercise per Session: Not on file  Stress:   . Feeling of Stress : Not on file  Social Connections:   . Frequency of Communication with Friends and Family: Not on file  . Frequency of Social Gatherings with Friends and Family: Not on file  . Attends Religious Services: Not on file  . Active Member of Clubs or Organizations: Not on file  . Attends Theatre manager Meetings: Not on file  . Marital Status: Not on file  Intimate Partner Violence:   . Fear of Current or Ex-Partner: Not on file  . Emotionally Abused: Not on file  . Physically Abused: Not on file  . Sexually Abused: Not on file    Allergies  Allergen Reactions  . Sulfa Antibiotics     rash  . Zithromax [Azithromycin]     rash    History reviewed. No pertinent family history.  Prior to Admission medications   Medication Sig Start Date End Date Taking? Authorizing Provider  amLODipine (NORVASC) 10 MG tablet Take 10 mg by mouth daily.   Yes [provider]  cholecalciferol (VITAMIN D3) 25 MCG (1000 UNIT) tablet Take 1,000 Units by mouth daily.   Yes [provider]  magnesium 30 MG tablet Take 30 mg by mouth daily.   Yes [provider]  metFORMIN (GLUCOPHAGE) 1000 MG tablet Take 1,000 mg by mouth 2 (two) times daily with a meal.   Yes [provider]  Multiple Vitamin (MULTIVITAMIN) tablet Take 1 tablet by mouth daily.   Yes [provider]  Omega-3 Fatty Acids (FISH OIL) 1200 MG CAPS Take 1 capsule by mouth daily.   Yes [provider]  aspirin 81 MG EC tablet Take 81 mg by mouth daily. Swallow whole.    [provider]    Physical Exam: Vitals:   06/04/20 0025 06/04/20 0211 06/04/20 0536 06/04/20 0653  BP: (!) 163/89 130/75 131/85 134/77  Pulse: (!) 126 (!) 107 87 89  Resp: (!) 37 (!) 30 (!) 24 20  Temp: (!) 101.8 F (38.8 C) 99 F (37.2 C) 98 F (36.7 C) 98 F (36.7 C)  TempSrc: Oral Oral Oral Oral  SpO2: 94% 96% 96% 90%  Weight:      Height:         . General:  Appears calm and comfortable and is NAD . Eyes:   EOMI, normal lids, iris . ENT:  grossly normal hearing, lips & tongue, mmm; appropriate dentition . Neck:  no LAD, masses or thyromegaly . Cardiovascular:  RRR, no m/r/g. No LE edema.  Marland Kitchen Respiratory:   Bibasilar rhonchi.  Normal to mildly increased respiratory effort. . Abdomen:   soft, NT, ND, NABS; ileostomy in place . Back:   normal alignment, no CVAT . Skin:  no rash or induration seen on limited exam . Musculoskeletal:  grossly normal tone BUE/BLE, good ROM, no bony abnormality . Psychiatric:  grossly normal mood and affect, speech fluent and appropriate, AOx3 . Neurologic:  CN 2-12 grossly intact, moves all extremities in coordinated fashion    Radiological Exams on Admission: DG Chest Port 1 View  Result Date: 06/03/2020 CLINICAL DATA:  Possible  aspiration after vomiting. EXAM: PORTABLE CHEST 1 VIEW COMPARISON:  June 03, 2020 (6:05 p.m.) FINDINGS: Mild, diffuse chronic appearing increased interstitial lung markings are seen. This is slightly more prominent within the bilateral lung bases and is unchanged in appearance when compared to the prior study. There is no evidence of acute infiltrate, pleural effusion or pneumothorax. The heart size and mediastinal contours are within normal limits. Multilevel degenerative changes are seen throughout the thoracic spine. IMPRESSION: Stable exam, without evidence of acute or active cardiopulmonary disease. Electronically Signed   By: Virgina Norfolk M.D.   On: 06/03/2020 21:39   DG Chest Port 1 View  Result Date: 06/03/2020 CLINICAL DATA:  Fever. EXAM: PORTABLE CHEST 1 VIEW COMPARISON:  Jan 31, 2018 FINDINGS: Mild chronic appearing increased interstitial lung markings are noted. This is more prominent within the bilateral lung bases. There is no evidence of a pleural effusion or pneumothorax. The heart size and mediastinal contours are within normal limits. Multilevel degenerative changes seen throughout the thoracic spine. IMPRESSION: Chronic appearing increased interstitial lung markings, without acute or active cardiopulmonary disease. Electronically Signed   By: Virgina Norfolk M.D.   On: 06/03/2020 18:40   CT Renal Stone Study  Result Date: 06/03/2020 CLINICAL DATA:  Fever and flank pain EXAM: CT ABDOMEN AND  PELVIS WITHOUT CONTRAST TECHNIQUE: Multidetector CT imaging of the abdomen and pelvis was performed following the standard protocol without IV contrast. COMPARISON:  None. FINDINGS: Lower chest: The lung bases are clear. The heart size is normal. Hepatobiliary: There is decreased hepatic attenuation suggestive of hepatic steatosis. Status post cholecystectomy.There is no biliary ductal dilation. Pancreas: Normal contours without ductal dilatation. No peripancreatic fluid collection. Spleen: Spleen is enlarged measuring 15 cm craniocaudad. Adrenals/Urinary Tract: --Adrenal glands: Unremarkable. --Right kidney/ureter: There is mild right-sided collecting system dilatation. There is a hyperdense 1.4 cm nodule in the interpolar region of the right kidney. --Left kidney/ureter: There is mild left-sided collecting system dilatation. There is an ileal conduit with a urostomy in the right lower quadrant. --Urinary bladder: Surgically absent Stomach/Bowel: --Stomach/Duodenum: No hiatal hernia or other gastric abnormality. Normal duodenal course and caliber. --Small bowel: There is a peristomal hernia in the right lower quadrant containing a loop of small bowel. There is no evidence for a small bowel obstruction. --Colon: There is scattered colonic diverticula without CT evidence for diverticulitis. --Appendix: Normal. Vascular/Lymphatic: There are atherosclerotic changes of the abdominal aorta with an infrarenal abdominal aortic aneurysm measuring approximately 3.4 cm. The bilateral common iliac arteries are ectatic measuring up to approximately 1.7 cm each. --No retroperitoneal lymphadenopathy. --No mesenteric lymphadenopathy. --No pelvic or inguinal lymphadenopathy. Reproductive: The patient is status post prostatectomy. Other: No ascites or free air. The abdominal wall is normal. Musculoskeletal. No acute displaced fractures. IMPRESSION: 1. Mild bilateral collecting system dilatation without evidence for obstructing  stone. The patient is status post cystoprostatectomy with ileal conduit formation. 2. There is a peristomal hernia in the right lower quadrant containing a loop of small bowel. There is no evidence for a small bowel obstruction. 3. There is a 1.4 cm hyperdense nodule in the interpolar region of the right kidney. A follow-up nonemergent outpatient renal ultrasound is recommended. 4. Hepatic steatosis. 5. Splenomegaly. Aortic Atherosclerosis (ICD10-I70.0). Electronically Signed   By: Constance Holster M.D.   On: 06/03/2020 19:14    EKG: Independently reviewed.  Sinus tachycardia with rate 127; nonspecific ST changes with no evidence of acute ischemia   Labs on Admission: I have personally reviewed the available labs  and imaging studies at the time of the admission.  Pertinent labs:   Na++ 126 Glucose 422 Bili 1.4 Lactate 2.0, 2.7 WBC 11.7 UA: >500 glucose, 100 protein, many bacteria   Assessment/Plan Principal Problem:   Sepsis due to undetermined organism [facility]) Active Problems:   Hypertension   Diabetes mellitus without complication (HCC)   Bladder cancer (HCC)   AAA (abdominal aortic aneurysm) (HCC)    Sepsis -SIRS criteria in this patient includes: Leukocytosis, fever, tachycardia, tachypnea, hypoxia  -Patient has evidence of acute organ failure with elevated lactate >2 that is not easily explained by another condition. -While awaiting blood cultures, this appears to be a preseptic condition. -Sepsis protocol initiated -Suspected source is unknown - COVID negative, UA not consistent with UTI, CXR not c/w PNA but with ongoing bibasilar rhonchi (may be due to scarring) will repeat 2 view CXR -Blood and urine cultures pending -Will check RVP to include influenza -Will admit with telemetry and continue to monitor -He was given Rocephin empirically for UTI coverage but for now will hold ongoing antibiotics unless a source becomes clear or symptoms recur (he currently feels  significantly better and sepsis physiology appears to have resolved) -Will add HIV -Will trend lactate to ensure improvement -Will order lower respiratory tract procalcitonin level.  Antibiotics would not be indicated for PCT <0.1 and probably should not be used for < 0.25.  >0.5 indicates infection and >>0.5 indicates more serious disease.  As the procalcitonin level normalizes, it will be reasonable to consider de-escalation of antibiotic coverage.  Uncontrolled DM -Blood sugar is quite elevated - possibly due to stress of infection -Patient reports h/o tachycardia after receiving insulin once and so does not want insulin coverage -Patient prefers Metformin to be continued, but I explained about contrast-induced nephropathy and will not order at this time -Will check A1c -hold Glucophage -Cover with moderate-scale SSI  Hyponatremia -Previously normal in March 2021 -Corrected Na++ from glucose is 131 -Suspect mild dehydration on presentation in the setting of acute illness -Will recheck today -Continue IVF through today as well and recheck in AM  HTN -Patient takes 2.5 mg Norvasc daily (not 10 mg) and this was continued  H/o Bladder cancer -Imaging unremarkable other than a renal nodule which needs outpatient f/u -Ileal conduit in place -No apparent UTI by UA but culture is pending -Fatty liver and splenomegaly still present, incidentally  AAA -3.7 cm -No concern at this time      Note: This patient has been tested and is negative for the novel coronavirus COVID-19.    DVT prophylaxis:  Lovenox Code Status:  Full - confirmed with patient Family Communication: None present Disposition Plan:  The patient is from: home  Anticipated d/c is to: home without [facility] services   Anticipated d/c date will depend on clinical response to treatment, but possibly as early as tomorrow if he has excellent response to treatment  Patient is currently: acutely ill Consults called: None   Admission status: Admit - It is my clinical opinion that admission to [place] is reasonable and necessary because of the expectation that this patient will require hospital care that crosses at least 2 midnights to treat this condition based on the medical complexity of the problems presented.  Given the aforementioned information, the predictability of an adverse outcome is felt to be significant.     Karmen Bongo MD [facility]   How to contact the [facility] Attending or Consulting provider [place] or covering provider during after  hours 7P -7A, for this patient?  1. Check the care team in [facility] and look for a) attending/consulting TRH provider listed and b) the [facility] team listed 2. Log into [email or web address] and use [place]'s universal password to access. If you do not have the password, please contact the hospital operator. 3. Locate the [facility] provider you are looking for under [facility] and page to a number that you can be directly reached. 4. If you still have difficulty reaching the provider, please page the [facility] (Director on Call) for the Hospitalists listed on amion for assistance.   06/04/2020, 8:57 AM

## 2020-06-04 NOTE — ED Notes (Signed)
Pt. Reports feeling weak.  Pt. Is alert and oriented and then reports he feels sleepy.

## 2020-06-04 NOTE — Progress Notes (Signed)
Incorrect time of assessment. Actual time is 0645 not 0545. 06/04/2020 ! 0645 Cyndi Bender, RN   06/04/20 [phone]  Charting Type  Charting Type Initial  Orders Chart Check (once per shift) Completed  Hiram Work Intensity Score (Update with each assessment and as needed)  Work Intensity Score (Level) 2  Level 2 Intensity C.Needs assistance with ADLs and mobility  Neurological  Neuro (WDL) WDL  R Pupil Reaction Brisk  L Pupil Reaction Brisk  Glasgow Coma Scale  Eye Opening 4  Best Verbal Response (NON-intubated) 5  Best Motor Response 6  Glasgow Coma Scale Score 15  NuDESC - Delirium Risk Factor Assessment (Complete for non-ICU patients)  Delirium Risk Factor Assessment Age greater than or equal to 54 years  NuDESC - Nursing Delirium Screening Scale (Complete for non-ICU patients)  Disorientation 0  Inappropriate Behavior 0  Inappropriate Communications 0  Illusions/hallucinations 0  Psychomotor Retardation 0  NuDESC Total Score 0  NuDESC - Delirium Prevention:  Universal Requirements (Complete for all non-ICU patients with a delirium risk factor)  Universal Precautions Initiated *See Row Information* Yes  HEENT  HEENT (WDL) WDL  Vision Check No  Respiratory  Respiratory (WDL) WDL  Respiratory Interventions Cough and deep breathe w/ teach back  Chest Assessment Chest expansion symmetrical  Cardiac  Cardiac (WDL) WDL  ECG Monitoring  Cardiac Rhythm NSR  Antiarrhythmic device  Antiarrhythmic device No  Vascular  Vascular (WDL) WDL  RUE Neurovascular Assessment  R Radial Pulse +2  LUE Neurovascular Assessment  L Radial Pulse +2  RLE Neurovascular Assessment  R Dorsalis Pedis Pulse +2  LLE Neurovascular Assessment  L Dorsalis Pedis Pulse +2  Integumentary  Integumentary (WDL) WDL  RN Assisting with Skin Assessment on Admission [place], RN  Medical Device Prophylactic Dressing  Skin Assessed Under All Medical Device Prophylactic Dressings (if applicable) Done - Skin  Intact  Braden Scale (Ages 8 and up)  Sensory Perceptions 3  Moisture 4  Activity 3  Mobility 3  Nutrition 3  Friction and Shear 3  Braden Scale Score 19  Musculoskeletal  Musculoskeletal (WDL) X  Assistive Device None  Generalized Weakness Yes  Weight Bearing Restrictions No  Gastrointestinal  Gastrointestinal (WDL) WDL  GU Assessment  Genitourinary (WDL) X  Genitourinary Symptoms Other (Comment)  Urine Characteristics  Urinary Incontinence No  Urine Color Amber  Urine Appearance Clear  Urostomy Ureterostomy right RLQ  No placement date or time found.   Urostomy Type: Ureterostomy right  Location: RLQ  Ostomy Pouch 2 piece  Stoma Assessment Pink  Peristomal Assessment Intact  Psychosocial  Psychosocial (WDL) WDL  Wound/Incision (LDAs)  Type of Wound/Incision (LDA) Incision  Neurological  Level of Consciousness Alert

## 2020-06-04 NOTE — ED Notes (Signed)
Attempted to call report to 6N x 2  No answer

## 2020-06-04 NOTE — Progress Notes (Signed)
PHARMACY - PHYSICIAN COMMUNICATION CRITICAL VALUE ALERT - BLOOD CULTURE IDENTIFICATION (BCID)  Shaun Pittman is an 64 y.o. male who presented to [facility] on 06/03/2020 with a chief complaint of fever.  Assessment:  Has been having fever and sweating with no URI symptoms. Has hx of bladder CA s/p cystoprostatectomy with pelvic LN dissection/ileal conduit. UA showed 21-50 WBC, many bacteria with negative nitrite/leukocytes. Aerobic bottle from 1 set of blood cx showing GNRs - with Klebsiella pneumoniae on BCID. UCx still pending.   Name of physician (or Provider) Contacted: Dr Lorin Mercy  Current antibiotics: On ceftriaxone and doxycycline for possible PNA.  Changes to prescribed antibiotics recommended:  Already covered with current order for ceftriaxone. No adjustments at this time.   Results for orders placed or performed during the hospital encounter of 06/03/20  Blood Culture ID Panel (Reflexed) (Collected: 06/03/2020  5:40 PM)  Result Value Ref Range   Enterococcus faecalis NOT DETECTED NOT DETECTED   Enterococcus Faecium NOT DETECTED NOT DETECTED   Listeria monocytogenes NOT DETECTED NOT DETECTED   Staphylococcus species NOT DETECTED NOT DETECTED   Staphylococcus aureus (BCID) NOT DETECTED NOT DETECTED   Staphylococcus epidermidis NOT DETECTED NOT DETECTED   Staphylococcus lugdunensis NOT DETECTED NOT DETECTED   Streptococcus species NOT DETECTED NOT DETECTED   Streptococcus agalactiae NOT DETECTED NOT DETECTED   Streptococcus pneumoniae NOT DETECTED NOT DETECTED   Streptococcus pyogenes NOT DETECTED NOT DETECTED   A.calcoaceticus-baumannii NOT DETECTED NOT DETECTED   Bacteroides fragilis NOT DETECTED NOT DETECTED   Enterobacterales DETECTED (A) NOT DETECTED   Enterobacter cloacae complex NOT DETECTED NOT DETECTED   Escherichia coli NOT DETECTED NOT DETECTED   Klebsiella aerogenes NOT DETECTED NOT DETECTED   Klebsiella oxytoca NOT DETECTED NOT DETECTED   Klebsiella pneumoniae  DETECTED (A) NOT DETECTED   Proteus species NOT DETECTED NOT DETECTED   Salmonella species NOT DETECTED NOT DETECTED   Serratia marcescens NOT DETECTED NOT DETECTED   Haemophilus influenzae NOT DETECTED NOT DETECTED   Neisseria meningitidis NOT DETECTED NOT DETECTED   Pseudomonas aeruginosa NOT DETECTED NOT DETECTED   Stenotrophomonas maltophilia NOT DETECTED NOT DETECTED   Candida albicans NOT DETECTED NOT DETECTED   Candida auris NOT DETECTED NOT DETECTED   Candida glabrata NOT DETECTED NOT DETECTED   Candida krusei NOT DETECTED NOT DETECTED   Candida parapsilosis NOT DETECTED NOT DETECTED   Candida tropicalis NOT DETECTED NOT DETECTED   Cryptococcus neoformans/gattii NOT DETECTED NOT DETECTED   CTX-M ESBL NOT DETECTED NOT DETECTED   Carbapenem resistance IMP NOT DETECTED NOT DETECTED   Carbapenem resistance KPC NOT DETECTED NOT DETECTED   Carbapenem resistance NDM NOT DETECTED NOT DETECTED   Carbapenem resist OXA 48 LIKE NOT DETECTED NOT DETECTED   Carbapenem resistance VIM NOT DETECTED NOT DETECTED    Antonietta Jewel, PharmD, BCCCP Clinical Pharmacist  Phone: [phone] 06/04/2020 3:56 PM  Please check AMION for all [facility] Pharmacy phone numbers After 10:00 PM, call [place] [phone]

## 2020-06-04 NOTE — Progress Notes (Signed)
Inpatient Diabetes Program Recommendations  AACE/ADA: New Consensus Statement on Inpatient Glycemic Control (2015)  Target Ranges:  Prepandial:   less than 140 mg/dL      Peak postprandial:   less than 180 mg/dL (1-2 hours)      Critically ill patients:  140 - 180 mg/dL   Lab Results  Component Value Date   GLUCAP 380 (H) 06/04/2020   HGBA1C 9.1 (H) 06/04/2020    Review of Glycemic Control Results for Shaun Pittman, Shaun Pittman (MRN [id number]) as of 06/04/2020 15:09  Ref. Range 06/04/2020 07:55 06/04/2020 11:31  Glucose-Capillary Latest Ref Range: 70 - 99 mg/dL 341 (H) 380 (H)   Diabetes history: Type 2 DM Outpatient Diabetes medications: Metformin 1000 mg BID Current orders for Inpatient glycemic control: Novolog 0-15 units TID, Novolog 0-5 units QHS  Inpatient Diabetes Program Recommendations:    Spoke with patient at length regarding outpatient diabetes management, as patient was refusing insulin while inpatient.  Per patient, "During my illiostomy, I received a large dose of insulin and as a result felt like I was going to die; my heart rate went up to the 180's."  Reviewed patient's current A1c of 9.1%. Explained what a A1c is and what it measures. Also reviewed goal A1c with patient, importance of good glucose control @ home, and blood sugar goals. Reviewed patho of DM, need for insulin given infection, risk of recurrent infection with poor glycemic control, vascular changes and comorbidities.  Patient has a meter and checks blood sugars 2 times per day. Patient reports that here lately they have been higher, especially since the [place] sent him this last quantity of Metformin from a different manufacturer. Reviewed extensively target goals and when to call MD. Also, reviewed other oral medications and GLP options that may help to bring A1C down. Patient is open GLP and discussion with outpatient PCP, however, has experienced hypoglycemia with other orals (could not remember brand or dose).  Patient  denies drinking sugary beverages and says that he is mindful of carb intake the majority of the time. Encouraged to continue with mindfulness.  After much discussion of insulin, patient is open to a conservative dose of Branscomb acting insulin: Levemir 8 units QD. Additionally, we discussed the potential need to increase based on glucose trends while inpatient. Patient is open to adjustments if discussed and progression is slow. Encouraged to reach out to PCP for appointment. Patient has no further questions at this time.   Thanks, Bronson Curb, MSN, RNC-OB Diabetes Coordinator [phone] (8a-5p)

## 2020-06-04 NOTE — Plan of Care (Signed)
  Problem: Education: Goal: Knowledge of General Education information will improve Description: Including pain rating scale, medication(s)/side effects and non-pharmacologic comfort measures Outcome: Progressing   Problem: Clinical Measurements: Goal: Respiratory complications will improve Outcome: Progressing Note: On room air   Problem: Activity: Goal: Risk for activity intolerance will decrease Outcome: Progressing Note: Independent in room tolerating well   Problem: Nutrition: Goal: Adequate nutrition will be maintained Outcome: Progressing   Problem: Coping: Goal: Level of anxiety will decrease Outcome: Progressing   Problem: Pain Managment: Goal: General experience of comfort will improve Outcome: Progressing Note: No complaints of pain

## 2020-06-04 NOTE — ED Notes (Signed)
Pt transferred to [facility] via [facility] 

## 2020-06-05 DIAGNOSIS — A419 Sepsis, unspecified organism: Secondary | ICD-10-CM

## 2020-06-05 DIAGNOSIS — A4159 Other Gram-negative sepsis: Secondary | ICD-10-CM | POA: Diagnosis not present

## 2020-06-05 DIAGNOSIS — R5081 Fever presenting with conditions classified elsewhere: Secondary | ICD-10-CM

## 2020-06-05 DIAGNOSIS — E119 Type 2 diabetes mellitus without complications: Secondary | ICD-10-CM

## 2020-06-05 DIAGNOSIS — I714 Abdominal aortic aneurysm, without rupture: Secondary | ICD-10-CM

## 2020-06-05 LAB — CBC
HCT: 36.9 % — ABNORMAL LOW (ref 39.0–52.0)
Hemoglobin: 12.6 g/dL — ABNORMAL LOW (ref 13.0–17.0)
MCH: 28.4 pg (ref 26.0–34.0)
MCHC: 34.1 g/dL (ref 30.0–36.0)
MCV: 83.1 fL (ref 80.0–100.0)
Platelets: 301 10*3/uL (ref 150–400)
RBC: 4.44 MIL/uL (ref 4.22–5.81)
RDW: 12.6 % (ref 11.5–15.5)
WBC: 5.9 10*3/uL (ref 4.0–10.5)
nRBC: 0 % (ref 0.0–0.2)

## 2020-06-05 LAB — BASIC METABOLIC PANEL
Anion gap: 8 (ref 5–15)
BUN: 9 mg/dL (ref 8–23)
CO2: 24 mmol/L (ref 22–32)
Calcium: 8.2 mg/dL — ABNORMAL LOW (ref 8.9–10.3)
Chloride: 101 mmol/L (ref 98–111)
Creatinine, Ser: 0.73 mg/dL (ref 0.61–1.24)
GFR calc Af Amer: >60 mL/min (ref >60)
GFR calc non Af Amer: >60 mL/min (ref >60)
Glucose, Bld: 298 mg/dL — ABNORMAL HIGH (ref 70–99)
Potassium: 3.5 mmol/L (ref 3.5–5.1)
Sodium: 133 mmol/L — ABNORMAL LOW (ref 135–145)

## 2020-06-05 LAB — GLUCOSE, CAPILLARY
Glucose-Capillary: 304 mg/dL — ABNORMAL HIGH (ref 70–99)
Glucose-Capillary: 333 mg/dL — ABNORMAL HIGH (ref 70–99)
Glucose-Capillary: 304 mg/dL — ABNORMAL HIGH (ref 70–99)
Glucose-Capillary: 204 mg/dL — ABNORMAL HIGH (ref 70–99)

## 2020-06-05 MED ORDER — INSULIN DETEMIR 100 UNIT/ML ~~LOC~~ SOLN
15.0000 [IU] | Freq: Every day | SUBCUTANEOUS | Status: DC
  Administered 2020-06-05: 15 [IU] via SUBCUTANEOUS
  Filled 2020-06-05 (×2): qty 0.15

## 2020-06-05 NOTE — Progress Notes (Signed)
Patient refusing meal coverage insulin despite explaination, MD aware.

## 2020-06-05 NOTE — Progress Notes (Signed)
[facility]                                                                              Patient Demographics  Shaun Pittman, is a 64 y.o. male, DOB - October 27, 1955, ONG:[id number]  Admit date - 06/03/2020   Admitting Physician Karmen Bongo, MD  Outpatient Primary MD for the patient is [facility], [facility]  Outpatient specialists:   LOS - 1  days   Medical records reviewed and are as summarized below:    Chief Complaint  Patient presents with   Fever       Brief summary   Patient is a 64 y.o. male with medical history significant of HTN; DM; bladder cancer s/p cystoprostatectomy with pelvic LN dissection and ileal conduit (2015); fatty liver; splenic hemangiomas; and AAA presented to [facility] for fevers.  Patient reported that in the beginning of the week he started having headache and low-grade fevers.  He went to the [place] on 9/16, and got tested for Covid which was negative.  A day before the admission, had a temp of 102 and elevated heart rate, vomiting x1.  Work-up in the ED showed leukocytosis, lactic acidosis, tachycardia, UA positive for UTI.  Covid test negative CT renal stone study did not show acute hydronephrosis or obstructing stone.  In ED, had an episode of vomiting, subsequently O2 sats dropped to upper 80s.  Chest x-ray was repeated.  Assessment & Plan    Principal Problem: Klebsiella pneumonia sepsis/bacteremia likely due to community-acquired pneumonia, possibility of pyelonephritis (patient had reported left flank pain) -Currently flank pain is resolved, no acute shortness of breath or chest pain. -Patient was placed on IV doxycycline, Rocephin -Blood cultures positive for Klebsiella pneumonia, sensitivities pending, urine culture showed multiple species.  Respiratory virus panel negative -Repeat chest x-ray showed slight internal progression in the mid to lower lungs interstitial prominence, could be due to interstitial edema or  atypical infection versus progression of chronic underlying interstitial disease -Follow repeat blood cultures -LFTs normal, no abdominal pain, to suggest any GI pathology  Active Problems:   Hypertension -BP currently stable    Diabetes mellitus without complication ([place]), uncontrolled with hyperglycemia -Patient  refused insulin coverage, -Lantus increased to 15 units, if he refuse Disla-acting insulin, will resume Metformin inpatient, no imaging studies are currently pending  History of bladder cancer ([place]) -Ileal conduit in place, imaging unremarkable other than a renal nodule, which patient follow.  Patient follows urology at the [facility].     AAA (abdominal aortic aneurysm) ([place]) 3.7 cm, outpatient follow-up   Code Status: Full code DVT Prophylaxis:  Lovenox  Family Communication: Discussed all imaging results, lab results, explained to the patient    Disposition Plan:     Status is: Inpatient  Remains inpatient appropriate because:Inpatient level of care appropriate due to severity of illness   Dispo: The patient is from: Home              Anticipated d/c is to: Home              Anticipated d/c date is: 2 days  Patient currently is not medically stable to d/c.  Pending repeat blood cultures and sensitivities, hoping to discharge home tomorrow if remains stable      Time Spent in minutes 35 minutes  Procedures:  CT abdomen  Consultants:   None  Antimicrobials:   Anti-infectives (From admission, onward)   Start     Dose/Rate Route Frequency Ordered Stop   06/04/20 1900  cefTRIAXone (ROCEPHIN) 2 g in sodium chloride 0.9 % 100 mL IVPB        2 g 200 mL/hr over 30 Minutes Intravenous Every 24 hours 06/04/20 1424 06/09/20 1859   06/04/20 1500  doxycycline (VIBRAMYCIN) 100 mg in sodium chloride 0.9 % 250 mL IVPB        100 mg 125 mL/hr over 120 Minutes Intravenous Every 12 hours 06/04/20 1424     06/03/20 1900  cefTRIAXone (ROCEPHIN) 1 g in sodium  chloride 0.9 % 100 mL IVPB        1 g 200 mL/hr over 30 Minutes Intravenous  Once 06/03/20 1845 06/03/20 2012          Medications  Scheduled Meds:  amLODipine  2.5 mg Oral Daily   aspirin  81 mg Oral Daily   enoxaparin (LOVENOX) injection  40 mg Subcutaneous Q24H   insulin aspart  0-15 Units Subcutaneous TID WC   insulin aspart  0-5 Units Subcutaneous QHS   insulin detemir  15 Units Subcutaneous QHS   sodium chloride flush  3 mL Intravenous Q12H   Continuous Infusions:  cefTRIAXone (ROCEPHIN)  IV 2 g (06/04/20 2018)   doxycycline (VIBRAMYCIN) IV 100 mg (06/05/20 0331)   lactated ringers 100 mL/hr at 06/05/20 1028   PRN Meds:.acetaminophen **OR** acetaminophen, ondansetron **OR** ondansetron (ZOFRAN) IV      Subjective:   Shaun Pittman was seen and examined today.  No overnight fever, feeling better since admission, currently not at baseline.  Weakness improving.  No nausea vomiting or diarrhea. Patient denies dizziness, chest pain, shortness of breath, abdominal pain, new weakness, numbess, tingling. No acute events overnight.    Objective:   Vitals:   06/04/20 1343 06/04/20 2120 06/05/20 0440 06/05/20 0512  BP: 138/77 140/83  133/71  Pulse: 99 97  88  Resp: 18 17  18   Temp: 98.5 F (36.9 C) 98.7 F (37.1 C)  98.6 F (37 C)  TempSrc: Oral Oral  Oral  SpO2: 92% 90%  94%  Weight:   78.4 kg   Height:        Intake/Output Summary (Last 24 hours) at 06/05/2020 1343 Last data filed at 06/05/2020 0745 Gross per 24 hour  Intake 1283.99 ml  Output 1150 ml  Net 133.99 ml     Wt Readings from Last 3 Encounters:  06/05/20 78.4 kg  11/17/19 79.8 kg  08/13/19 80.7 kg     Exam  General: Alert and oriented x 3, NAD  Cardiovascular: S1 S2 auscultated, no murmurs, RRR  Respiratory: Clear to auscultation bilaterally, no wheezing, rales or rhonchi  Gastrointestinal: Soft, nontender, nondistended, + bowel sounds, ileostomy in place  Ext: no pedal edema  bilaterally  Neuro: no new deficits  Musculoskeletal: No digital cyanosis, clubbing  Skin: No rashes  Psych: Normal affect and demeanor, alert and oriented x3    Data Reviewed:  I have personally reviewed following labs and imaging studies  Micro Results Recent Results (from the past 240 hour(s))  Culture, blood (Routine x 2)     Status: Abnormal (Preliminary result)   Collection  Time: 06/03/20  5:40 PM   Specimen: BLOOD  Result Value Ref Range Status   Specimen Description   Final    BLOOD LEFT ANTECUBITAL Performed at [facility], [place] [address], [place], [place] [address]    Special Requests   Final    BOTTLES DRAWN AEROBIC AND ANAEROBIC Blood Culture results may not be optimal due to an inadequate volume of blood received in culture bottles Performed at [facility], [address] [place], [place] [address]    Culture  Setup Time   Final    GRAM NEGATIVE RODS AEROBIC BOTTLE ONLY Organism ID to follow CRITICAL RESULT CALLED TO, READ BACK BY AND VERIFIED WITH: Lanae Boast PharmD 15:45 06/04/20 ([email or web address]) Performed at [facility], [place] [address] [place], [place] [address]    Culture KLEBSIELLA PNEUMONIAE (A)  Final   Report Status PENDING  Incomplete  Blood Culture ID Panel (Reflexed)     Status: Abnormal   Collection Time: 06/03/20  5:40 PM  Result Value Ref Range Status   Enterococcus faecalis NOT DETECTED NOT DETECTED Final   Enterococcus Faecium NOT DETECTED NOT DETECTED Final   Listeria monocytogenes NOT DETECTED NOT DETECTED Final   Staphylococcus species NOT DETECTED NOT DETECTED Final   Staphylococcus aureus (BCID) NOT DETECTED NOT DETECTED Final   Staphylococcus epidermidis NOT DETECTED NOT DETECTED Final   Staphylococcus lugdunensis NOT DETECTED NOT DETECTED Final   Streptococcus species NOT DETECTED NOT DETECTED Final   Streptococcus agalactiae NOT DETECTED NOT DETECTED Final   Streptococcus pneumoniae NOT DETECTED NOT DETECTED Final    Streptococcus pyogenes NOT DETECTED NOT DETECTED Final   A.calcoaceticus-baumannii NOT DETECTED NOT DETECTED Final   Bacteroides fragilis NOT DETECTED NOT DETECTED Final   Enterobacterales DETECTED (A) NOT DETECTED Final    Comment: Enterobacterales represent a large order of gram negative bacteria, not a single organism. CRITICAL RESULT CALLED TO, READ BACK BY AND VERIFIED WITH: Lanae Boast PharmD 15:45 06/04/20 ([email or web address])    Enterobacter cloacae complex NOT DETECTED NOT DETECTED Final   Escherichia coli NOT DETECTED NOT DETECTED Final   Klebsiella aerogenes NOT DETECTED NOT DETECTED Final   Klebsiella oxytoca NOT DETECTED NOT DETECTED Final   Klebsiella pneumoniae DETECTED (A) NOT DETECTED Final    Comment: CRITICAL RESULT CALLED TO, READ BACK BY AND VERIFIED WITH: Lanae Boast PharmD 15:45 06/04/20 ([email or web address])    Proteus species NOT DETECTED NOT DETECTED Final   Salmonella species NOT DETECTED NOT DETECTED Final   Serratia marcescens NOT DETECTED NOT DETECTED Final   Haemophilus influenzae NOT DETECTED NOT DETECTED Final   Neisseria meningitidis NOT DETECTED NOT DETECTED Final   Pseudomonas aeruginosa NOT DETECTED NOT DETECTED Final   Stenotrophomonas maltophilia NOT DETECTED NOT DETECTED Final   Candida albicans NOT DETECTED NOT DETECTED Final   Candida auris NOT DETECTED NOT DETECTED Final   Candida glabrata NOT DETECTED NOT DETECTED Final   Candida krusei NOT DETECTED NOT DETECTED Final   Candida parapsilosis NOT DETECTED NOT DETECTED Final   Candida tropicalis NOT DETECTED NOT DETECTED Final   Cryptococcus neoformans/gattii NOT DETECTED NOT DETECTED Final   CTX-M ESBL NOT DETECTED NOT DETECTED Final   Carbapenem resistance IMP NOT DETECTED NOT DETECTED Final   Carbapenem resistance KPC NOT DETECTED NOT DETECTED Final   Carbapenem resistance NDM NOT DETECTED NOT DETECTED Final   Carbapenem resist OXA 48 LIKE NOT DETECTED NOT DETECTED Final   Carbapenem resistance VIM NOT DETECTED NOT  DETECTED Final  Comment: Performed at [facility], [place] [address] [place], [place] [address]  SARS Coronavirus 2 by RT PCR (hospital order, performed in [facility] hospital lab) Nasopharyngeal Nasopharyngeal Swab     Status: None   Collection Time: 06/03/20  6:03 PM   Specimen: Nasopharyngeal Swab  Result Value Ref Range Status   SARS Coronavirus 2 NEGATIVE NEGATIVE Final    Comment: (NOTE) SARS-CoV-2 target nucleic acids are NOT DETECTED.  The SARS-CoV-2 RNA is generally detectable in upper and lower respiratory specimens during the acute phase of infection. The lowest concentration of SARS-CoV-2 viral copies this assay can detect is 250 copies / mL. A negative result does not preclude SARS-CoV-2 infection and should not be used as the sole basis for treatment or other patient management decisions.  A negative result may occur with improper specimen collection / handling, submission of specimen other than nasopharyngeal swab, presence of viral mutation(s) within the areas targeted by this assay, and inadequate number of viral copies (<250 copies / mL). A negative result must be combined with clinical observations, patient history, and epidemiological information.  Fact Sheet for Patients:   StrictlyIdeas.no  Fact Sheet for Healthcare Providers: BankingDealers.co.za  This test is not yet approved or  cleared by the [place] FDA and has been authorized for detection and/or diagnosis of SARS-CoV-2 by FDA under an Emergency Use Authorization (EUA).  This EUA will remain in effect (meaning this test can be used) for the duration of the COVID-19 declaration under Section 564(b)(1) of the Act, 21 U.S.C. section 360bbb-3(b)(1), unless the authorization is terminated or revoked sooner.  Performed at [facility], [address] [place], [place] [address]   Urine culture     Status: Abnormal   Collection Time:  06/03/20  6:03 PM   Specimen: Urine, Clean Catch  Result Value Ref Range Status   Specimen Description   Final    URINE, CLEAN CATCH Performed at [facility], [address] [place], [place] [address]    Special Requests   Final    NONE Performed at [facility], [address] [place], [place] [address]    Culture MULTIPLE SPECIES PRESENT, SUGGEST RECOLLECTION (A)  Final   Report Status 06/04/2020 FINAL  Final  Respiratory Panel by PCR     Status: None   Collection Time: 06/04/20 10:44 AM   Specimen: Nasopharyngeal Swab; Respiratory  Result Value Ref Range Status   Adenovirus NOT DETECTED NOT DETECTED Final   Coronavirus 229E NOT DETECTED NOT DETECTED Final    Comment: (NOTE) The Coronavirus on the Respiratory Panel, DOES NOT test for the novel  Coronavirus (2019 nCoV)    Coronavirus HKU1 NOT DETECTED NOT DETECTED Final   Coronavirus NL63 NOT DETECTED NOT DETECTED Final   Coronavirus OC43 NOT DETECTED NOT DETECTED Final   Metapneumovirus NOT DETECTED NOT DETECTED Final   Rhinovirus / Enterovirus NOT DETECTED NOT DETECTED Final   Influenza A NOT DETECTED NOT DETECTED Final   Influenza B NOT DETECTED NOT DETECTED Final   Parainfluenza Virus 1 NOT DETECTED NOT DETECTED Final   Parainfluenza Virus 2 NOT DETECTED NOT DETECTED Final   Parainfluenza Virus 3 NOT DETECTED NOT DETECTED Final   Parainfluenza Virus 4 NOT DETECTED NOT DETECTED Final   Respiratory Syncytial Virus NOT DETECTED NOT DETECTED Final   Bordetella pertussis NOT DETECTED NOT DETECTED Final   Chlamydophila pneumoniae NOT DETECTED NOT DETECTED Final   Mycoplasma pneumoniae NOT  DETECTED NOT DETECTED Final    Comment: Performed at [facility], [place] [address], [place], [place] [address]    Radiology Reports DG Chest 2 View  Result Date: 06/04/2020 CLINICAL DATA:  Fever. EXAM: CHEST - 2 VIEW COMPARISON:  06/03/2020, 01/31/2018 FINDINGS: Lungs are adequately without focal airspace  consolidation or effusion. There is subtle interstitial prominence of the mid to lower lungs with slight interval progression. Cardiomediastinal silhouette and remainder the exam is unchanged. IMPRESSION: Subtle interstitial prominence of the mid to lower lungs with slight interval progression. Findings may be acute or chronic and could be due to interstitial edema or atypical infection versus progression of chronic underlying interstitial disease. Electronically Signed   By: Marin Olp M.D.   On: 06/04/2020 09:41   DG Chest Port 1 View  Result Date: 06/03/2020 CLINICAL DATA:  Possible aspiration after vomiting. EXAM: PORTABLE CHEST 1 VIEW COMPARISON:  June 03, 2020 (6:05 p.m.) FINDINGS: Mild, diffuse chronic appearing increased interstitial lung markings are seen. This is slightly more prominent within the bilateral lung bases and is unchanged in appearance when compared to the prior study. There is no evidence of acute infiltrate, pleural effusion or pneumothorax. The heart size and mediastinal contours are within normal limits. Multilevel degenerative changes are seen throughout the thoracic spine. IMPRESSION: Stable exam, without evidence of acute or active cardiopulmonary disease. Electronically Signed   By: Virgina Norfolk M.D.   On: 06/03/2020 21:39   DG Chest Port 1 View  Result Date: 06/03/2020 CLINICAL DATA:  Fever. EXAM: PORTABLE CHEST 1 VIEW COMPARISON:  Jan 31, 2018 FINDINGS: Mild chronic appearing increased interstitial lung markings are noted. This is more prominent within the bilateral lung bases. There is no evidence of a pleural effusion or pneumothorax. The heart size and mediastinal contours are within normal limits. Multilevel degenerative changes seen throughout the thoracic spine. IMPRESSION: Chronic appearing increased interstitial lung markings, without acute or active cardiopulmonary disease. Electronically Signed   By: Virgina Norfolk M.D.   On: 06/03/2020 18:40   CT  Renal Stone Study  Result Date: 06/03/2020 CLINICAL DATA:  Fever and flank pain EXAM: CT ABDOMEN AND PELVIS WITHOUT CONTRAST TECHNIQUE: Multidetector CT imaging of the abdomen and pelvis was performed following the standard protocol without IV contrast. COMPARISON:  None. FINDINGS: Lower chest: The lung bases are clear. The heart size is normal. Hepatobiliary: There is decreased hepatic attenuation suggestive of hepatic steatosis. Status post cholecystectomy.There is no biliary ductal dilation. Pancreas: Normal contours without ductal dilatation. No peripancreatic fluid collection. Spleen: Spleen is enlarged measuring 15 cm craniocaudad. Adrenals/Urinary Tract: --Adrenal glands: Unremarkable. --Right kidney/ureter: There is mild right-sided collecting system dilatation. There is a hyperdense 1.4 cm nodule in the interpolar region of the right kidney. --Left kidney/ureter: There is mild left-sided collecting system dilatation. There is an ileal conduit with a urostomy in the right lower quadrant. --Urinary bladder: Surgically absent Stomach/Bowel: --Stomach/Duodenum: No hiatal hernia or other gastric abnormality. Normal duodenal course and caliber. --Small bowel: There is a peristomal hernia in the right lower quadrant containing a loop of small bowel. There is no evidence for a small bowel obstruction. --Colon: There is scattered colonic diverticula without CT evidence for diverticulitis. --Appendix: Normal. Vascular/Lymphatic: There are atherosclerotic changes of the abdominal aorta with an infrarenal abdominal aortic aneurysm measuring approximately 3.4 cm. The bilateral common iliac arteries are ectatic measuring up to approximately 1.7 cm each. --No retroperitoneal lymphadenopathy. --No mesenteric lymphadenopathy. --No pelvic or inguinal lymphadenopathy. Reproductive: The patient is  status post prostatectomy. Other: No ascites or free air. The abdominal wall is normal. Musculoskeletal. No acute displaced  fractures. IMPRESSION: 1. Mild bilateral collecting system dilatation without evidence for obstructing stone. The patient is status post cystoprostatectomy with ileal conduit formation. 2. There is a peristomal hernia in the right lower quadrant containing a loop of small bowel. There is no evidence for a small bowel obstruction. 3. There is a 1.4 cm hyperdense nodule in the interpolar region of the right kidney. A follow-up nonemergent outpatient renal ultrasound is recommended. 4. Hepatic steatosis. 5. Splenomegaly. Aortic Atherosclerosis (ICD10-I70.0). Electronically Signed   By: Constance Holster M.D.   On: 06/03/2020 19:14    Lab Data:  CBC: Recent Labs  Lab 06/03/20 1745 06/04/20 0915 06/05/20 1158  WBC 11.7* 8.4 5.9  NEUTROABS 9.3* 6.2  --   HGB 14.8 13.8 12.6*  HCT 42.1 41.6 36.9*  MCV 82.2 84.6 83.1  PLT 316 281 532   Basic Metabolic Panel: Recent Labs  Lab 06/03/20 1745 06/04/20 0915 06/05/20 1158  NA 126* 133* 133*  K 4.0 4.1 3.5  CL 93* 99 101  CO2 21* 24 24  GLUCOSE 422* 342* 298*  BUN 12 11 9   CREATININE 0.92 0.90 0.73  CALCIUM 9.7 9.3 8.2*   GFR: Estimated Creatinine Clearance: 99.7 mL/min (by C-G formula based on SCr of 0.73 mg/dL). Liver Function Tests: Recent Labs  Lab 06/03/20 1745 06/04/20 0915  AST 29 32  ALT 36 37  ALKPHOS 71 66  BILITOT 1.4* 1.0  PROT 7.8 7.1  ALBUMIN 3.7 3.2*   No results for input(s): LIPASE, AMYLASE in the last 168 hours. No results for input(s): AMMONIA in the last 168 hours. Coagulation Profile: Recent Labs  Lab 06/03/20 1745  INR 1.1   Cardiac Enzymes: No results for input(s): CKTOTAL, CKMB, CKMBINDEX, TROPONINI in the last 168 hours. BNP (last 3 results) No results for input(s): PROBNP in the last 8760 hours. HbA1C: Recent Labs    06/04/20 0915  HGBA1C 9.1*   CBG: Recent Labs  Lab 06/04/20 1131 06/04/20 1654 06/04/20 2117 06/05/20 0740 06/05/20 1231  GLUCAP 380* 299* 306* 304* 333*   Lipid  Profile: No results for input(s): CHOL, HDL, LDLCALC, TRIG, CHOLHDL, LDLDIRECT in the last 72 hours. Thyroid Function Tests: No results for input(s): TSH, T4TOTAL, FREET4, T3FREE, THYROIDAB in the last 72 hours. Anemia Panel: No results for input(s): VITAMINB12, FOLATE, FERRITIN, TIBC, IRON, RETICCTPCT in the last 72 hours. Urine analysis:    Component Value Date/Time   COLORURINE YELLOW 06/03/2020 1745   APPEARANCEUR HAZY (A) 06/03/2020 1745   LABSPEC <1.005 (L) 06/03/2020 1745   PHURINE 7.0 06/03/2020 1745   GLUCOSEU >=500 (A) 06/03/2020 1745   HGBUR MODERATE (A) 06/03/2020 1745   BILIRUBINUR NEGATIVE 06/03/2020 1745   KETONESUR NEGATIVE 06/03/2020 1745   PROTEINUR 100 (A) 06/03/2020 1745   NITRITE NEGATIVE 06/03/2020 1745   LEUKOCYTESUR NEGATIVE 06/03/2020 1745     Rashan Rounsaville M.D. [facility] 06/05/2020, 1:43 PM   Call night coverage person covering after 7pm

## 2020-06-05 NOTE — Progress Notes (Signed)
Inpatient Diabetes Program Recommendations  AACE/ADA: New Consensus Statement on Inpatient Glycemic Control (2015)  Target Ranges:  Prepandial:   less than 140 mg/dL      Peak postprandial:   less than 180 mg/dL (1-2 hours)      Critically ill patients:  140 - 180 mg/dL   Lab Results  Component Value Date   GLUCAP 304 (H) 06/05/2020   HGBA1C 9.1 (H) 06/04/2020    Review of Glycemic Control Results for Shaun Pittman, Shaun Pittman (MRN [id number]) as of 06/05/2020 11:58  Ref. Range 06/04/2020 07:55 06/04/2020 11:31 06/04/2020 16:54 06/04/2020 21:17 06/05/2020 07:40  Glucose-Capillary Latest Ref Range: 70 - 99 mg/dL 341 (H) 380 (H) 299 (H) 306 (H) 304 (H)   Diabetes history: Type 2 DM Outpatient Diabetes medications: Metformin 1000 mg BID Current orders for Inpatient glycemic control: Lantus 8 units, Novolog 0-15 units TID, Novolog 0-5 units QHS  Inpatient Diabetes Program Recommendations:   Fasting CBG 341 -Increase Lantus to 15 units daily (0.2 units/kg x 78.4 kg = 15.7 units) Secure chat sent to Dr. Tana Coast.  Thank you, Nani Gasser. Mckenzye Cutright, RN, MSN, CDE  Diabetes Coordinator Inpatient Glycemic Control Team Team Pager [phone] (8am-5pm) 06/05/2020 12:05 PM

## 2020-06-06 DIAGNOSIS — N3000 Acute cystitis without hematuria: Secondary | ICD-10-CM

## 2020-06-06 LAB — GLUCOSE, CAPILLARY
Glucose-Capillary: 218 mg/dL — ABNORMAL HIGH (ref 70–99)
Glucose-Capillary: 189 mg/dL — ABNORMAL HIGH (ref 70–99)

## 2020-06-06 LAB — CULTURE, BLOOD (ROUTINE X 2)

## 2020-06-06 MED ORDER — CEFUROXIME AXETIL 500 MG PO TABS: 500.0000 mg | ORAL_TABLET | Freq: Two times a day (BID) | ORAL | 0 refills | Status: AC

## 2020-06-06 MED ORDER — DOXYCYCLINE HYCLATE 100 MG PO TABS: 100.0000 mg | ORAL_TABLET | Freq: Two times a day (BID) | ORAL | 0 refills | Status: AC

## 2020-06-06 MED ORDER — DOXYCYCLINE HYCLATE 100 MG PO TABS: 100.0000 mg | ORAL_TABLET | Freq: Two times a day (BID) | ORAL | Status: DC

## 2020-06-06 NOTE — Plan of Care (Signed)

## 2020-06-06 NOTE — Progress Notes (Signed)
Patient discharged home with instructions. 

## 2020-06-06 NOTE — Discharge Summary (Signed)
Physician Discharge Summary   Patient ID: Shaun Pittman MRN: [id number] DOB/AGE: Feb 05, 1956 64 y.o.  Admit date: 06/03/2020 Discharge date: 06/06/2020  Primary Care Physician:  Cl[facility], [facility]   Recommendations for Outpatient Follow-up:  1. Follow up with PCP in 1-2 weeks 2. Patient recommended to follow-up with his PCP regarding diabetes management.  He will benefit from Trulicity or insulin.  Currently on Metformin, per patient he has not tolerated sulfonylureas in the past.  Home Health: Currently ambulating at baseline Equipment/Devices:   Discharge Condition: stable  CODE STATUS: FULL   Diet recommendation: Carb modified diet   Discharge Diagnoses:    . Community-acquired pneumonia . Sepsis, Klebsiella pneumonia bacteremia ([place]) . Hypertension . History of bladder cancer ([place]) . AAA (abdominal aortic aneurysm) ([place]) without rupture Diabetes mellitus type 2, uncontrolled, NIDDM with hyperglycemia  Consults: None    Allergies:   Allergies  Allergen Reactions  . Sulfa Antibiotics     rash  . Zithromax [Azithromycin]     rash     DISCHARGE MEDICATIONS: Allergies as of 06/06/2020      Reactions   Sulfa Antibiotics    rash   Zithromax [azithromycin]    rash      Medication List    TAKE these medications   acetaminophen 500 MG tablet Commonly known as: TYLENOL Take 500 mg by mouth every 6 (six) hours as needed for mild pain.   amLODipine 2.5 MG tablet Commonly known as: NORVASC Take 2.5 mg by mouth daily.   aspirin 81 MG EC tablet Take 81 mg by mouth daily. Swallow whole.   cefUROXime 500 MG tablet Commonly known as: CEFTIN Take 1 tablet (500 mg total) by mouth 2 (two) times daily with a meal for 7 days.   cholecalciferol 25 MCG (1000 UNIT) tablet Commonly known as: VITAMIN D3 Take 1,000 Units by mouth daily.   doxycycline 100 MG tablet Commonly known as: VIBRA-TABS Take 1 tablet (100 mg total) by mouth 2 (two) times daily for 7 days.    Fish Oil 1200 MG Caps Take 1 capsule by mouth daily.   ibuprofen 200 MG tablet Commonly known as: ADVIL Take 200 mg by mouth every 6 (six) hours as needed for moderate pain.   magnesium 30 MG tablet Take 30 mg by mouth daily.   metFORMIN 500 MG 24 hr tablet Commonly known as: GLUCOPHAGE-XR Take 1,000 mg by mouth 2 (two) times daily after a meal.   multivitamin tablet Take 1 tablet by mouth daily. Auto Immune Multi Vit for Diabetic's        Brief H and P: For complete details please refer to admission H and P, but in brief *Patient is a 64 y.o.malewith medical history significant ofHTN; DM; bladder cancer s/p cystoprostatectomy with pelvic LN dissection and ileal conduit (2015); fatty liver; splenic hemangiomas; and AAA presented to [facility] for fevers.  Patient reported that in the beginning of the week he started having headache and low-grade fevers.  He went to the [place] on 9/16, and got tested for Covid which was negative.  A day before the admission, had a temp of 102 and elevated heart rate, vomiting x1. Work-up in the ED showed leukocytosis, lactic acidosis, tachycardia, UA positive for UTI.  Covid test negative CT renal stone study did not show acute hydronephrosis or obstructing stone.  In ED, had an episode of vomiting, subsequently O2 sats dropped to upper 80s.  Chest x-ray was repeated.  Hospital Course:   Klebsiella  pneumonia sepsis/bacteremia likely due to community-acquired pneumonia, - possibility of pyelonephritis (patient had reported left flank pain) prior to admission however CT abdomen was negative. Currently flank pain is resolved, no acute shortness of breath or chest pain. -Patient was placed on IV doxycycline, Rocephin -Blood cultures positive for Klebsiella pneumonia, urine culture showed multiple species.  Respiratory virus panel negative -Repeat chest x-ray showed slight internal progression in the mid to lower lungs interstitial prominence,  could be due to interstitial edema or atypical infection versus progression of chronic underlying interstitial disease -LFTs normal, no abdominal pain, to suggest any GI pathology -Per sensitivities, patient transition to oral Ceftin and doxycycline for 7 days      Hypertension -BP currently stable    Diabetes mellitus type II, NIDDM, uncontrolled with hyperglycemia -Patient  refused insulin coverage, while inpatient -Continue Metformin 1000 mg twice daily.  Hemoglobin A1c 9.1.  Discussed in detail with the patient, he has not tolerated oral sulfonylureas in the past due to severe hypoglycemia. Recommended he could try Trulicity or insulin, he will discuss with his PCP I  History of bladder cancer ([place]) -Ileal conduit in place, imaging unremarkable other than a renal nodule, which patient follow.  Patient [facility] urology at the [facility]    AAA (abdominal aortic aneurysm) ([place]) 3.7 cm, outpatient follow-up    Day of Discharge S: Doing well, no fevers, no flank pain, feels close to his baseline, hoping to go home      BP 133/86 (BP Location: Right Arm)   Pulse 79   Temp 98.4 F (36.9 C) (Oral)   Resp 16   Ht 6' (1.829 m)   Wt 78.4 kg   SpO2 97%   BMI 23.44 kg/m   Physical Exam: General: Alert and awake oriented x3 not in any acute distress. HEENT: anicteric sclera, pupils reactive to light and accommodation CVS: S1-S2 clear no murmur rubs or gallops Chest: clear to auscultation bilaterally, no wheezing rales or rhonchi Abdomen: soft nontender, nondistended, normal bowel sounds Extremities: no cyanosis, clubbing or edema noted bilaterally Neuro: Cranial nerves II-XII intact, no focal neurological deficits    Get Medicines reviewed and adjusted: Please take all your medications with you for your next visit with your Primary MD  Please request your Primary MD to go over all hospital tests and procedure/radiological results at the follow up. Please ask your Primary MD to  get all Hospital records sent to his/her office.  If you experience worsening of your admission symptoms, develop shortness of breath, life threatening emergency, suicidal or homicidal thoughts you must seek medical attention immediately by calling 911 or calling your MD immediately  if symptoms less severe.  You must read complete instructions/literature along with all the possible adverse reactions/side effects for all the Medicines you take and that have been prescribed to you. Take any new Medicines after you have completely understood and accept all the possible adverse reactions/side effects.   Do not drive when taking pain medications.   Do not take more than prescribed Pain, Sleep and Anxiety Medications  Special Instructions: If you have smoked or chewed Tobacco  in the last 2 yrs please stop smoking, stop any regular Alcohol  and or any Recreational drug use.  Wear Seat belts while driving.  Please note  You were cared for by a hospitalist during your hospital stay. Once you are discharged, your primary care physician will handle any further medical issues. Please note that NO REFILLS for any discharge medications will be authorized  once you are discharged, as it is imperative that you return to your primary care physician (or establish a relationship with a primary care physician if you do not have one) for your aftercare needs so that they can reassess your need for medications and monitor your lab values.   The results of significant diagnostics from this hospitalization (including imaging, microbiology, ancillary and laboratory) are listed below for reference.      Procedures/Studies:  DG Chest 2 View  Result Date: 06/04/2020 CLINICAL DATA:  Fever. EXAM: CHEST - 2 VIEW COMPARISON:  06/03/2020, 01/31/2018 FINDINGS: Lungs are adequately without focal airspace consolidation or effusion. There is subtle interstitial prominence of the mid to lower lungs with slight interval  progression. Cardiomediastinal silhouette and remainder the exam is unchanged. IMPRESSION: Subtle interstitial prominence of the mid to lower lungs with slight interval progression. Findings may be acute or chronic and could be due to interstitial edema or atypical infection versus progression of chronic underlying interstitial disease. Electronically Signed   By: Marin Olp M.D.   On: 06/04/2020 09:41   DG Chest Port 1 View  Result Date: 06/03/2020 CLINICAL DATA:  Possible aspiration after vomiting. EXAM: PORTABLE CHEST 1 VIEW COMPARISON:  June 03, 2020 (6:05 p.m.) FINDINGS: Mild, diffuse chronic appearing increased interstitial lung markings are seen. This is slightly more prominent within the bilateral lung bases and is unchanged in appearance when compared to the prior study. There is no evidence of acute infiltrate, pleural effusion or pneumothorax. The heart size and mediastinal contours are within normal limits. Multilevel degenerative changes are seen throughout the thoracic spine. IMPRESSION: Stable exam, without evidence of acute or active cardiopulmonary disease. Electronically Signed   By: Virgina Norfolk M.D.   On: 06/03/2020 21:39   DG Chest Port 1 View  Result Date: 06/03/2020 CLINICAL DATA:  Fever. EXAM: PORTABLE CHEST 1 VIEW COMPARISON:  Jan 31, 2018 FINDINGS: Mild chronic appearing increased interstitial lung markings are noted. This is more prominent within the bilateral lung bases. There is no evidence of a pleural effusion or pneumothorax. The heart size and mediastinal contours are within normal limits. Multilevel degenerative changes seen throughout the thoracic spine. IMPRESSION: Chronic appearing increased interstitial lung markings, without acute or active cardiopulmonary disease. Electronically Signed   By: Virgina Norfolk M.D.   On: 06/03/2020 18:40   CT Renal Stone Study  Result Date: 06/03/2020 CLINICAL DATA:  Fever and flank pain EXAM: CT ABDOMEN AND PELVIS  WITHOUT CONTRAST TECHNIQUE: Multidetector CT imaging of the abdomen and pelvis was performed following the standard protocol without IV contrast. COMPARISON:  None. FINDINGS: Lower chest: The lung bases are clear. The heart size is normal. Hepatobiliary: There is decreased hepatic attenuation suggestive of hepatic steatosis. Status post cholecystectomy.There is no biliary ductal dilation. Pancreas: Normal contours without ductal dilatation. No peripancreatic fluid collection. Spleen: Spleen is enlarged measuring 15 cm craniocaudad. Adrenals/Urinary Tract: --Adrenal glands: Unremarkable. --Right kidney/ureter: There is mild right-sided collecting system dilatation. There is a hyperdense 1.4 cm nodule in the interpolar region of the right kidney. --Left kidney/ureter: There is mild left-sided collecting system dilatation. There is an ileal conduit with a urostomy in the right lower quadrant. --Urinary bladder: Surgically absent Stomach/Bowel: --Stomach/Duodenum: No hiatal hernia or other gastric abnormality. Normal duodenal course and caliber. --Small bowel: There is a peristomal hernia in the right lower quadrant containing a loop of small bowel. There is no evidence for a small bowel obstruction. --Colon: There is scattered colonic diverticula without CT evidence for  diverticulitis. --Appendix: Normal. Vascular/Lymphatic: There are atherosclerotic changes of the abdominal aorta with an infrarenal abdominal aortic aneurysm measuring approximately 3.4 cm. The bilateral common iliac arteries are ectatic measuring up to approximately 1.7 cm each. --No retroperitoneal lymphadenopathy. --No mesenteric lymphadenopathy. --No pelvic or inguinal lymphadenopathy. Reproductive: The patient is status post prostatectomy. Other: No ascites or free air. The abdominal wall is normal. Musculoskeletal. No acute displaced fractures. IMPRESSION: 1. Mild bilateral collecting system dilatation without evidence for obstructing stone. The  patient is status post cystoprostatectomy with ileal conduit formation. 2. There is a peristomal hernia in the right lower quadrant containing a loop of small bowel. There is no evidence for a small bowel obstruction. 3. There is a 1.4 cm hyperdense nodule in the interpolar region of the right kidney. A follow-up nonemergent outpatient renal ultrasound is recommended. 4. Hepatic steatosis. 5. Splenomegaly. Aortic Atherosclerosis (ICD10-I70.0). Electronically Signed   By: Constance Holster M.D.   On: 06/03/2020 19:14       LAB RESULTS: Basic Metabolic Panel: Recent Labs  Lab 06/04/20 0915 06/05/20 1158  NA 133* 133*  K 4.1 3.5  CL 99 101  CO2 24 24  GLUCOSE 342* 298*  BUN 11 9  CREATININE 0.90 0.73  CALCIUM 9.3 8.2*   Liver Function Tests: Recent Labs  Lab 06/03/20 1745 06/04/20 0915  AST 29 32  ALT 36 37  ALKPHOS 71 66  BILITOT 1.4* 1.0  PROT 7.8 7.1  ALBUMIN 3.7 3.2*   No results for input(s): LIPASE, AMYLASE in the last 168 hours. No results for input(s): AMMONIA in the last 168 hours. CBC: Recent Labs  Lab 06/04/20 0915 06/04/20 0915 06/05/20 1158  WBC 8.4  --  5.9  NEUTROABS 6.2  --   --   HGB 13.8  --  12.6*  HCT 41.6  --  36.9*  MCV 84.6   < > 83.1  PLT 281  --  301   < > = values in this interval not displayed.   Cardiac Enzymes: No results for input(s): CKTOTAL, CKMB, CKMBINDEX, TROPONINI in the last 168 hours. BNP: Invalid input(s): POCBNP CBG: Recent Labs  Lab 06/06/20 0742 06/06/20 1132  GLUCAP 218* 189*       Disposition and Follow-up: Discharge Instructions    Diet Carb Modified   Complete by: As directed    Discharge instructions   Complete by: As directed    It is VERY IMPORTANT that you follow up with a PCP on a regular basis.  Check your blood glucoses before each meal and at bedtime and maintain a log of your readings.  Bring this log with you when you follow up with your PCP so that he or she can adjust your medications at your  follow up visit.   Increase activity slowly   Complete by: As directed        DISPOSITION: home    [place]. Schedule an appointment as soon as possible for a visit in 2 week(s).   Contact information: [place] [address] [phone]                Time coordinating discharge:  35 mins   Signed:   Estill Cotta M.D. [facility] 06/06/2020, 12:42 PM

## 2020-06-10 LAB — CULTURE, BLOOD (ROUTINE X 2)
Culture: NO GROWTH
Culture: NO GROWTH
Special Requests: ADEQUATE
Special Requests: ADEQUATE

## 2020-11-05 ENCOUNTER — Emergency Department (HOSPITAL_BASED_OUTPATIENT_CLINIC_OR_DEPARTMENT_OTHER)
Admission: EM | Admit: 2020-11-05 | Discharge: 2020-11-05 | Disposition: A | Discharge status: Home / Self Care | Payer: No Typology Code available for payment source | Attending: Emergency Medicine | Admitting: Emergency Medicine

## 2020-11-05 ENCOUNTER — Emergency Department (HOSPITAL_BASED_OUTPATIENT_CLINIC_OR_DEPARTMENT_OTHER): Payer: No Typology Code available for payment source

## 2020-11-05 ENCOUNTER — Encounter (HOSPITAL_BASED_OUTPATIENT_CLINIC_OR_DEPARTMENT_OTHER): Payer: Self-pay

## 2020-11-05 ENCOUNTER — Other Ambulatory Visit: Payer: Self-pay

## 2020-11-05 DIAGNOSIS — R519 Headache, unspecified: Secondary | ICD-10-CM | POA: Insufficient documentation

## 2020-11-05 DIAGNOSIS — Z87891 Personal history of nicotine dependence: Secondary | ICD-10-CM | POA: Insufficient documentation

## 2020-11-05 DIAGNOSIS — M542 Cervicalgia: Secondary | ICD-10-CM | POA: Diagnosis not present

## 2020-11-05 DIAGNOSIS — Z8551 Personal history of malignant neoplasm of bladder: Secondary | ICD-10-CM | POA: Diagnosis not present

## 2020-11-05 DIAGNOSIS — M549 Dorsalgia, unspecified: Secondary | ICD-10-CM | POA: Diagnosis not present

## 2020-11-05 DIAGNOSIS — Z7984 Long term (current) use of oral hypoglycemic drugs: Secondary | ICD-10-CM | POA: Diagnosis not present

## 2020-11-05 DIAGNOSIS — Z7982 Long term (current) use of aspirin: Secondary | ICD-10-CM | POA: Diagnosis not present

## 2020-11-05 DIAGNOSIS — I10 Essential (primary) hypertension: Secondary | ICD-10-CM | POA: Diagnosis not present

## 2020-11-05 DIAGNOSIS — E119 Type 2 diabetes mellitus without complications: Secondary | ICD-10-CM | POA: Diagnosis not present

## 2020-11-05 LAB — CBC WITH DIFFERENTIAL/PLATELET
Abs Immature Granulocytes: 0.07 10*3/uL (ref 0.00–0.07)
Basophils Absolute: 0.1 10*3/uL (ref 0.0–0.1)
Basophils Relative: 1 %
Eosinophils Absolute: 0.5 10*3/uL (ref 0.0–0.5)
Eosinophils Relative: 6 %
HCT: 42.9 % (ref 39.0–52.0)
Hemoglobin: 14.6 g/dL (ref 13.0–17.0)
Immature Granulocytes: 1 %
Lymphocytes Relative: 25 %
Lymphs Abs: 2 10*3/uL (ref 0.7–4.0)
MCH: 29.1 pg (ref 26.0–34.0)
MCHC: 34 g/dL (ref 30.0–36.0)
MCV: 85.5 fL (ref 80.0–100.0)
Monocytes Absolute: 0.8 10*3/uL (ref 0.1–1.0)
Monocytes Relative: 10 %
Neutro Abs: 4.6 10*3/uL (ref 1.7–7.7)
Neutrophils Relative %: 57 %
Platelets: 329 10*3/uL (ref 150–400)
RBC: 5.02 MIL/uL (ref 4.22–5.81)
RDW: 13.3 % (ref 11.5–15.5)
WBC: 8.1 10*3/uL (ref 4.0–10.5)
nRBC: 0 % (ref 0.0–0.2)

## 2020-11-05 LAB — BASIC METABOLIC PANEL
Anion gap: 10 (ref 5–15)
BUN: 13 mg/dL (ref 8–23)
CO2: 24 mmol/L (ref 22–32)
Calcium: 9.5 mg/dL (ref 8.9–10.3)
Chloride: 100 mmol/L (ref 98–111)
Creatinine, Ser: 0.83 mg/dL (ref 0.61–1.24)
GFR, Estimated: >60 mL/min (ref >60)
Glucose, Bld: 274 mg/dL — ABNORMAL HIGH (ref 70–99)
Potassium: 3.8 mmol/L (ref 3.5–5.1)
Sodium: 134 mmol/L — ABNORMAL LOW (ref 135–145)

## 2020-11-05 NOTE — Medical Student Note (Signed)
[facility] DEPT [facility] Provider Student Note For educational purposes for Medical, PA and NP students only and not part of the legal medical record.   CSN: [id number] Arrival date & time: 11/05/20  1622      History   Chief Complaint Chief Complaint  Patient presents with  . Headache    HPI Shaun Pittman is a 65 y.o. male with PMH of AAA, bladder cancer s/p urostomy, T2DM, HTN who presents for headache.  States that he has been having intermittent headache for two weeks now. He states headache is sometimes behind his eyes, at times temporal and at times occipital. He states that the pain behind his eyes is most worrisome because this is new. He has tried ibuprofen with mild relief. States it is worse in the morning. He endorses photophobia, and intermittent blurry vision that is worse in the left eye. He is unsure if this is related to his diabetes or Ozempic which he recently started. Denies nausea or vomiting, fevers, halos or auras, phonophobia. Denies weakness, slurred speech or difficulty swallowing. Denies falls or trauma to the head. Not anticoagulated. No bleeding disorders in the family.   He has further concern that this is either all related to the Ozempic that he started, chronic sinus congestion or related to his previous TM perforation. States that his primary care doctor will not prescribe him neomycin drops which can alleviate his headaches. He is also concerned headache is related to stressors. Has had two sisters, a brother and two nieces pass away in the past 1.5 years.   Past Medical History:  Diagnosis Date  . AAA (abdominal aortic aneurysm) ([place])   . Bladder cancer ([place])    has ileostomy  . Diabetes mellitus without complication ([place])   . Hypertension     Patient Active Problem List   Diagnosis Date Noted  . Sepsis due to undetermined organism ([place]) 06/04/2020  . Hypertension   . Diabetes mellitus without complication ([place])   . Bladder cancer ([place])   . AAA  (abdominal aortic aneurysm) ([place])   . Splenic lesion 11/18/2019   Past Surgical History:  Procedure Laterality Date  . BLADDER SURGERY    . HERNIA REPAIR    . ILEOSTOMY      Home Medications    Prior to Admission medications   Medication Sig Start Date End Date Taking? Authorizing Provider  acetaminophen (TYLENOL) 500 MG tablet Take 500 mg by mouth every 6 (six) hours as needed for mild pain.    [provider]  amLODipine (NORVASC) 2.5 MG tablet Take 2.5 mg by mouth daily.     [provider]  aspirin 81 MG EC tablet Take 81 mg by mouth daily. Swallow whole. Patient not taking: Reported on 06/04/2020    [provider]  cholecalciferol (VITAMIN D3) 25 MCG (1000 UNIT) tablet Take 1,000 Units by mouth daily.    [provider]  ibuprofen (ADVIL) 200 MG tablet Take 200 mg by mouth every 6 (six) hours as needed for moderate pain.    [provider]  magnesium 30 MG tablet Take 30 mg by mouth daily.    [provider]  metFORMIN (GLUCOPHAGE-XR) 500 MG 24 hr tablet Take 1,000 mg by mouth 2 (two) times daily after a meal.    [provider]  Multiple Vitamin (MULTIVITAMIN) tablet Take 1 tablet by mouth daily. Auto Immune Multi Vit for Diabetic's    [provider]  Omega-3 Fatty Acids (FISH OIL) 1200 MG  CAPS Take 1 capsule by mouth daily.    [provider]    Family History No family history on file.  Social History Social History   Tobacco Use  . Smoking status: Former Smoker    Packs/day: 1.00    Years: 40.00    Pack years: 40.00    Quit date: 06/13/2020    Years since quitting: 0.3  . Smokeless tobacco: Never Used  . Tobacco comment: down to 1/3-1/2 pack  Substance Use Topics  . Alcohol use: Never  . Drug use: Never   Allergies   Sulfa antibiotics and Zithromax [azithromycin]  Review of Systems Review of Systems  Constitutional: Negative.  Negative for fever.  HENT: Positive for ear pain  and sinus pressure.   Eyes: Positive for photophobia and visual disturbance.  Respiratory: Negative.   Cardiovascular: Negative.   Gastrointestinal: Negative.   Endocrine: Negative.   Genitourinary: Negative.   Musculoskeletal: Positive for neck pain.  Skin: Negative.   Allergic/Immunologic: Negative.   Neurological: Negative.   Hematological: Negative.   Psychiatric/Behavioral: Negative.    Physical Exam Updated Vital Signs BP (!) 161/90 (BP Location: Right Arm)   Pulse 74   Temp 97.8 F (36.6 C) (Oral)   Resp 16   Ht 6' (1.829 m)   Wt 82.6 kg   SpO2 98%   BMI 24.68 kg/m   Physical Exam Constitutional:      General: He is not in acute distress.    Appearance: He is well-developed.  HENT:     Head: Normocephalic and atraumatic.     Mouth/Throat:     Mouth: Mucous membranes are moist.     Pharynx: Oropharynx is clear.  Eyes:     Extraocular Movements: Extraocular movements intact.     Right eye: Normal extraocular motion and no nystagmus.     Left eye: Normal extraocular motion and no nystagmus.     Pupils: Pupils are equal, round, and reactive to light.  Cardiovascular:     Rate and Rhythm: Normal rate and regular rhythm.     Heart sounds: Normal heart sounds. No murmur heard.   Pulmonary:     Effort: Pulmonary effort is normal.     Breath sounds: Normal breath sounds.  Abdominal:     General: Bowel sounds are normal.     Palpations: Abdomen is soft.     Tenderness: There is no abdominal tenderness.  Musculoskeletal:        General: Normal range of motion.     Cervical back: Normal range of motion and neck supple. No rigidity.  Lymphadenopathy:     Cervical: No cervical adenopathy.  Skin:    General: Skin is warm and dry.     Capillary Refill: Capillary refill takes less than 2 seconds.  Neurological:     Mental Status: He is alert and oriented to person, place, and time.     GCS: GCS eye subscore is 4. GCS verbal subscore is 5. GCS motor subscore is 6.      Cranial Nerves: No cranial nerve deficit, dysarthria or facial asymmetry.     Sensory: No sensory deficit.     Motor: No weakness.  Psychiatric:        Mood and Affect: Mood normal.        Behavior: Behavior normal.    ED Treatments / Results  Labs (all labs ordered are listed, but only abnormal results are displayed) Labs Reviewed  BASIC METABOLIC PANEL - Abnormal; Notable for the  following components:      Result Value   Sodium 134 (*)    Glucose, Bld 274 (*)    All other components within normal limits  CBC WITH DIFFERENTIAL/PLATELET   EKG  Radiology No results found.  Procedures Procedures (including critical care time)  Medications Ordered in ED Medications - No data to display  Initial Impression / Assessment and Plan / ED Course  I have reviewed the triage vital signs and the nursing notes.  Pertinent labs & imaging results that were available during my care of the patient were reviewed by me and considered in my medical decision making (see chart for details).  Shaun Pittman is a 63yoM with chief complaint of headache.   Low suspicion for acute bleed or ischemia. He neurological exam is benign. He has no focal deficits or red flag symptoms. Sounds more chronic in nature. Cannot be sure if this is related to his new medications, however he can contact PCP about changing this if it continues to be a concern.   CT head negative Labs unremarkable.   Discharge with PCP follow-up.   Final Clinical Impressions(s) / ED Diagnoses   Final diagnoses:  None    New Prescriptions New Prescriptions   No medications on file

## 2020-11-05 NOTE — ED Provider Notes (Signed)
[facility] Provider Note   CSN: [id number] Arrival date & time: 11/05/20  1622     History Chief Complaint  Patient presents with  . Headache    Shaun Pittman is a 65 y.o. male w PMHx AAA, bladder cancer, HTN, DM, presenting to the ED with complaint of 2 weeks of intermittent dull headache. HA migrates, sometimes occipital and and sometimes behind his eyes. HA feels similar to sinus headaches which he gets frequently.  Headache is improved with sinus rinses. Also has neck and back tightness, worse in the morning when he wakes.  Endorses increased stress level over the last year or so with multiple deaths in the family.  Thinks this also may be contributing. Endorses intermittent blurry of his vision described as improving.  Denies phonophobia, N/V, vision loss, fever, weakness, slurred speech. No recent head injury. No anticoagulation.  Treated with ibuprofen with mild relief.  He is currently asymptomatic.  He started a new diabetes medication, ozempic, and has read the side effects and is concerned that his presentation today may possibly be related.   The history is provided by the patient.       Past Medical History:  Diagnosis Date  . AAA (abdominal aortic aneurysm) ([place])   . Bladder cancer ([place])    has ileostomy  . Diabetes mellitus without complication ([place])   . Hypertension     Patient Active Problem List   Diagnosis Date Noted  . Sepsis due to undetermined organism ([place]) 06/04/2020  . Hypertension   . Diabetes mellitus without complication ([place])   . Bladder cancer ([place])   . AAA (abdominal aortic aneurysm) ([place])   . Splenic lesion 11/18/2019    Past Surgical History:  Procedure Laterality Date  . BLADDER SURGERY    . HERNIA REPAIR    . ILEOSTOMY         No family history on file.  Social History   Tobacco Use  . Smoking status: Former Smoker    Packs/day: 1.00    Years: 40.00    Pack years: 40.00    Quit date:  06/13/2020    Years since quitting: 0.3  . Smokeless tobacco: Never Used  . Tobacco comment: down to 1/3-1/2 pack  Substance Use Topics  . Alcohol use: Never  . Drug use: Never    Home Medications Prior to Admission medications   Medication Sig Start Date End Date Taking? Authorizing Provider  acetaminophen (TYLENOL) 500 MG tablet Take 500 mg by mouth every 6 (six) hours as needed for mild pain.    [provider]  amLODipine (NORVASC) 2.5 MG tablet Take 2.5 mg by mouth daily.     [provider]  aspirin 81 MG EC tablet Take 81 mg by mouth daily. Swallow whole. Patient not taking: Reported on 06/04/2020    [provider]  cholecalciferol (VITAMIN D3) 25 MCG (1000 UNIT) tablet Take 1,000 Units by mouth daily.    [provider]  ibuprofen (ADVIL) 200 MG tablet Take 200 mg by mouth every 6 (six) hours as needed for moderate pain.    [provider]  magnesium 30 MG tablet Take 30 mg by mouth daily.    [provider]  metFORMIN (GLUCOPHAGE-XR) 500 MG 24 hr tablet Take 1,000 mg by mouth 2 (two) times daily after a meal.    [provider]  Multiple Vitamin (MULTIVITAMIN) tablet Take 1 tablet by mouth daily. Auto Immune Multi Vit for Diabetic's  [provider]  Omega-3 Fatty Acids (FISH OIL) 1200 MG CAPS Take 1 capsule by mouth daily.    [provider]    Allergies    Sulfa antibiotics and Zithromax [azithromycin]  Review of Systems   Review of Systems  Neurological: Positive for headaches.  All other systems reviewed and are negative.   Physical Exam Updated Vital Signs BP (!) 143/72   Pulse 69   Temp 97.8 F (36.6 C) (Oral)   Resp 16   Ht 6' (1.829 m)   Wt 82.6 kg   SpO2 99%   BMI 24.68 kg/m   Physical Exam Vitals and nursing note reviewed.  Constitutional:      General: He is not in acute distress.    Appearance: He is well-developed and well-nourished. He is not ill-appearing.   HENT:     Head: Normocephalic and atraumatic.     Ears:     Comments: Scarring to the left TM with what appears to be perforation (chronic per patient)  No effusion noted bilaterally Eyes:     Extraocular Movements: Extraocular movements intact.     Conjunctiva/sclera: Conjunctivae normal.     Pupils: Pupils are equal, round, and reactive to light.  Cardiovascular:     Rate and Rhythm: Normal rate and regular rhythm.  Pulmonary:     Effort: Pulmonary effort is normal. No respiratory distress.     Breath sounds: Normal breath sounds.  Abdominal:     Palpations: Abdomen is soft.  Musculoskeletal:     Cervical back: Normal range of motion and neck supple. No rigidity or tenderness.  Lymphadenopathy:     Cervical: No cervical adenopathy.  Skin:    General: Skin is warm.  Neurological:     Mental Status: He is alert.     Comments: Mental Status:  Alert, oriented, thought content appropriate, able to give a coherent history. Speech fluent without evidence of aphasia. Able to follow 2 step commands without difficulty.  Cranial Nerves:  II:  pupils equal, round, reactive to light III,IV, VI: ptosis not present, extra-ocular motions intact bilaterally  V,VII: smile symmetric, facial light touch sensation equal VIII: hearing grossly normal to voice  X: uvula elevates symmetrically  XI: bilateral shoulder shrug symmetric and strong XII: midline tongue extension without fassiculations Motor:  Normal tone. 5/5 strength in upper and lower extremities bilaterally including strong and equal grip strength and dorsiflexion/plantar flexion Sensory: grossly normal in all extremities.  Cerebellar: normal finger-to-nose with bilateral upper extremities Gait: normal gait and balance CV: distal pulses palpable throughout    Psychiatric:        Mood and Affect: Mood and affect normal.        Behavior: Behavior normal.     ED Results / Procedures / Treatments   Labs (all labs ordered are  listed, but only abnormal results are displayed) Labs Reviewed  BASIC METABOLIC PANEL - Abnormal; Notable for the following components:      Result Value   Sodium 134 (*)    Glucose, Bld 274 (*)    All other components within normal limits  CBC WITH DIFFERENTIAL/PLATELET    EKG None  Radiology CT Head Wo Contrast  Result Date: 11/05/2020 CLINICAL DATA:  Headache and blurred vision x2 weeks. EXAM: CT HEAD WITHOUT CONTRAST TECHNIQUE: Contiguous axial images were obtained from the base of the skull through the vertex without intravenous contrast. COMPARISON:  Jan 13, 2018 FINDINGS: Brain: There is mild cerebral atrophy with widening of the  extra-axial spaces and ventricular dilatation. There are areas of decreased attenuation within the white matter tracts of the supratentorial brain, consistent with microvascular disease changes. A tiny chronic right basal ganglia lacunar infarct is seen. Vascular: No hyperdense vessel or unexpected calcification. Skull: Normal. Negative for fracture or focal lesion. Sinuses/Orbits: Very mild sphenoid sinus mucosal thickening is seen. Other: None. IMPRESSION: 1. No acute intracranial abnormality. 2. Very mild cerebral atrophy and microvascular disease changes of the supratentorial brain. Electronically Signed   By: Virgina Norfolk M.D.   On: 11/05/2020 19:26    Procedures Procedures   Medications Ordered in ED Medications - No data to display  ED Course  I have reviewed the triage vital signs and the nursing notes.  Pertinent labs & imaging results that were available during my care of the patient were reviewed by me and considered in my medical decision making (see chart for details).    MDM Rules/Calculators/A&P                          Patient presenting to the emergency department with 2 weeks of intermittent migrating headaches.  He is also having some intermittent bilateral blurry vision though that is improving.  His headaches feel similar to  his sinus headache.  Also wonders if his symptoms may be related to a recent change in his diabetes medications.  States he was unable to be seen by his PCP therefore he came in for evaluation after calling the [place] triage line who recommended he be seen.  He is not symptomatic in the ED. he is very well-appearing.  He has no focal neuro deficits.  His basic labs are without significant acute significant change from baseline.  CT head is also obtained and is negative.   Recommend he follow-up with PCP regarding concerns for medication side effect.  If he is unable to tolerate, he can consider discontinuing with close PCP follow-up.  Patient is in agreement with this plan, states he just wanted to be sure serious was occurring.  Discussed concerning symptoms including symptoms of stroke for which she should return to the ED for reevaluation.  Recommend oral hydration, continue sinus rinses if this provides relief.  Patient is appropriate for discharge.  Of note, patient requested neomycin drops for his left ear.  States he used to be prescribed that chronically for his left ear after having surgery in his eardrum.  He states whenever his ear was bothering him his ENT in [place] prescribed him this medication.  However, neomycin is an aminoglycoside and can be ototoxic.  Do not feel comfortable prescribing this medication with known chronic TM perforation.  He is provided with an ENT referral for follow-up.  Discussed results, findings, treatment and follow up. Patient advised of return precautions. Patient verbalized understanding and agreed with plan.  Final Clinical Impression(s) / ED Diagnoses Final diagnoses:  Intermittent headache    Rx / DC Orders ED Discharge Orders    None       Robinson, [place] N, PA-C 11/05/20 1948    Drenda Freeze, MD 11/06/20 [phone]

## 2020-11-05 NOTE — Discharge Instructions (Signed)
Please follow closely with your primary care provider regarding your visit today. Discuss your concerns with your new diabetes medication with your PCP. You have also been provided with an ENT referral for follow-up of your chronic left ear problem.

## 2020-11-05 NOTE — ED Triage Notes (Signed)
Pt states that he started Ozempic 3 weeks ago for his diabetes reports over the last two weeks he has had blurred vision, headaches, and swollen glands in his neck.

## 2020-12-13 ENCOUNTER — Emergency Department (HOSPITAL_BASED_OUTPATIENT_CLINIC_OR_DEPARTMENT_OTHER)
Admission: EM | Admit: 2020-12-13 | Discharge: 2020-12-13 | Disposition: A | Discharge status: Home / Self Care | Payer: No Typology Code available for payment source | Attending: Emergency Medicine | Admitting: Emergency Medicine

## 2020-12-13 ENCOUNTER — Other Ambulatory Visit: Payer: Self-pay

## 2020-12-13 ENCOUNTER — Other Ambulatory Visit (HOSPITAL_COMMUNITY): Payer: Self-pay | Admitting: Emergency Medicine

## 2020-12-13 DIAGNOSIS — E119 Type 2 diabetes mellitus without complications: Secondary | ICD-10-CM | POA: Diagnosis not present

## 2020-12-13 DIAGNOSIS — Z7984 Long term (current) use of oral hypoglycemic drugs: Secondary | ICD-10-CM | POA: Insufficient documentation

## 2020-12-13 DIAGNOSIS — Z79899 Other long term (current) drug therapy: Secondary | ICD-10-CM | POA: Diagnosis not present

## 2020-12-13 DIAGNOSIS — J3489 Other specified disorders of nose and nasal sinuses: Secondary | ICD-10-CM | POA: Diagnosis present

## 2020-12-13 DIAGNOSIS — I10 Essential (primary) hypertension: Secondary | ICD-10-CM | POA: Insufficient documentation

## 2020-12-13 DIAGNOSIS — Z8551 Personal history of malignant neoplasm of bladder: Secondary | ICD-10-CM | POA: Diagnosis not present

## 2020-12-13 DIAGNOSIS — Z7982 Long term (current) use of aspirin: Secondary | ICD-10-CM | POA: Diagnosis not present

## 2020-12-13 DIAGNOSIS — J019 Acute sinusitis, unspecified: Secondary | ICD-10-CM | POA: Insufficient documentation

## 2020-12-13 MED ORDER — DOXYCYCLINE HYCLATE 100 MG PO TABS: 100.0000 mg | ORAL_TABLET | Freq: Two times a day (BID) | ORAL | 0 refills | Status: DC

## 2020-12-13 MED FILL — DOXYCYCLINE HYCLATE 100 MG: 100 | 14 days supply | Qty: 28 | Fill #0

## 2020-12-13 NOTE — ED Notes (Signed)
Pt requesting abx for sinus infection - c/o pain bilat ears, sore throat, green mucus  [facility]to an E[facility].

## 2020-12-13 NOTE — ED Provider Notes (Signed)
[facility] DEPART[facility] Provider Note   CSN: [id number] Arrival date & time: 12/13/20  1505     History Chief Complaint  Patient presents with  . Sinusitis    Shaun Pittman is a 65 y.o. male.  HPI   Pt presents with complaints of acute sinus infection.  Pt states he has been having trouble since Feb.  He was seen in the ED and had a CT scan of his brain as well as labs.  [facility]  He had an out patient CT scan that was performed on March 10.  Patient has the results with him.  That CT scan showed findings suggestive of acute sinusitis.  Patient states he has not spoken to his doctor since that test result.  He can get in touch with him.  He was never prescribed any antibiotics.  Patient's not having any fevers but he is having pressure in his sinuses.  He also has ear pain as well as pain in his gums.  Patient feels like he needs to be on antibiotics and came to the ED for assistance. Past Medical History:  Diagnosis Date  . AAA (abdominal aortic aneurysm) ([place])   . Bladder cancer ([place])    has ileostomy  . Diabetes mellitus without complication ([place])   . Hypertension     Patient Active Problem List   Diagnosis Date Noted  . Sepsis due to undetermined organism ([place]) 06/04/2020  . Hypertension   . Diabetes mellitus without complication ([place])   . Bladder cancer ([place])   . AAA (abdominal aortic aneurysm) ([place])   . Splenic lesion 11/18/2019    Past Surgical History:  Procedure Laterality Date  . BLADDER SURGERY    . HERNIA REPAIR    . ILEOSTOMY         No family history on file.  Social History   Tobacco Use  . Smoking status: Former Smoker    Packs/day: 1.00    Years: 40.00    Pack years: 40.00    Quit date: 06/13/2020    Years since quitting: 0.5  . Smokeless tobacco: Never Used  . Tobacco comment: down to 1/3-1/2 pack  Substance Use Topics  . Alcohol use: Never  . Drug use: Never     Home Medications Prior to Admission medications   Medication Sig Start Date End Date Taking? Authorizing Provider  doxycycline (VIBRA-TABS) 100 MG tablet Take 1 tablet (100 mg total) by mouth 2 (two) times daily. 12/13/20  Yes Dorie Rank, MD  acetaminophen (TYLENOL) 500 MG tablet Take 500 mg by mouth every 6 (six) hours as needed for mild pain.    [provider]  amLODipine (NORVASC) 2.5 MG tablet Take 2.5 mg by mouth daily.     [provider]  aspirin 81 MG EC tablet Take 81 mg by mouth daily. Swallow whole. Patient not taking: Reported on 06/04/2020    [provider]  cholecalciferol (VITAMIN D3) 25 MCG (1000 UNIT) tablet Take 1,000 Units by mouth daily.    [provider]  ibuprofen (ADVIL) 200 MG tablet Take 200 mg by mouth every 6 (six) hours as needed for moderate pain.    [provider]  magnesium 30 MG tablet Take 30 mg by mouth daily.    [provider]  metFORMIN (GLUCOPHAGE-XR) 500 MG 24 hr tablet Take 1,000 mg by mouth 2 (two) times daily after a meal.  [provider]  Multiple Vitamin (MULTIVITAMIN) tablet Take 1 tablet by mouth daily. Auto Immune Multi Vit for Diabetic's    [provider]  Omega-3 Fatty Acids (FISH OIL) 1200 MG CAPS Take 1 capsule by mouth daily.    [provider]    Allergies    Sulfa antibiotics and Zithromax [azithromycin]  Review of Systems   Review of Systems  All other systems reviewed and are negative.   Physical Exam Updated Vital Signs BP (!) 176/100 (BP Location: Right Arm)   Pulse 87   Temp 97.6 F (36.4 C) (Oral)   Resp 20   Ht 1.829 m (6')   Wt 82.6 kg   SpO2 99%   BMI 24.68 kg/m   Physical Exam Vitals and nursing note reviewed.  Constitutional:      General: He is not in acute distress.    Appearance: He is well-developed.  HENT:     Head: Normocephalic and atraumatic.     Comments: Maxillary sinus tenderness palpation, no mucopurulent  nasal drainage    Right Ear: External ear normal.     Left Ear: External ear normal.     Mouth/Throat:     Pharynx: No posterior oropharyngeal erythema.  Eyes:     General: No scleral icterus.       Right eye: No discharge.        Left eye: No discharge.     Conjunctiva/sclera: Conjunctivae normal.  Neck:     Trachea: No tracheal deviation.  Cardiovascular:     Rate and Rhythm: Normal rate and regular rhythm.  Pulmonary:     Effort: Pulmonary effort is normal. No respiratory distress.     Breath sounds: Normal breath sounds. No stridor. No wheezing or rales.  Abdominal:     General: Bowel sounds are normal. There is no distension.     Palpations: Abdomen is soft.     Tenderness: There is no abdominal tenderness. There is no guarding or rebound.  Musculoskeletal:        General: No tenderness.     Cervical back: Neck supple.  Skin:    General: Skin is warm and dry.     Findings: No rash.  Neurological:     Mental Status: He is alert.     Cranial Nerves: No cranial nerve deficit (no facial droop, extraocular movements intact, no slurred speech).     Sensory: No sensory deficit.     Motor: No abnormal muscle tone or seizure activity.     Coordination: Coordination normal.     ED Results / Procedures / Treatments   Labs (all labs ordered are listed, but only abnormal results are displayed) Labs Reviewed - No data to display  EKG None  Radiology No results found.  Procedures Procedures   Medications Ordered in ED Medications - No data to display  ED Course  I have reviewed the triage vital signs and the nursing notes.  Pertinent labs & imaging results that were available during my care of the patient were reviewed by me and considered in my medical decision making (see chart for details).    MDM Rules/Calculators/A&P                          Patient is presenting with symptoms concerning for sinusitis.  He has been having symptoms for a couple of weeks now.   He had an outpatient CT scan that does show acute sinusitis.  I  think it is reasonable start him on a course of antibiotics.  Discussed over-the-counter use of antibiotics.  Patient does have plans to get a referral to an ENT doctor.  Patient blood pressure also is elevated.  He is having some pain discomfort.  He does take blood pressure medications.  Recommend he continue to track his blood pressure and follow-up with his doctor to have that rechecked. Final Clinical Impression(s) / ED Diagnoses Final diagnoses:  Acute sinusitis, recurrence not specified, unspecified location    Rx / DC Orders ED Discharge Orders         Ordered    doxycycline (VIBRA-TABS) 100 MG tablet  2 times daily        12/13/20 1535           Dorie Rank, MD 12/13/20 [phone]

## 2020-12-13 NOTE — ED Triage Notes (Signed)
Sinus pressure one week

## 2020-12-13 NOTE — Discharge Instructions (Signed)
Take the abx as prescribed.  Continue your sinus rinses.  Take over the counter medications as needed.  Your blood pressure was elevated today.  That can be related to pain.  Follow up with your doctor routinely to have that rechecked.

## 2021-04-29 ENCOUNTER — Encounter (HOSPITAL_BASED_OUTPATIENT_CLINIC_OR_DEPARTMENT_OTHER): Payer: Self-pay

## 2021-04-29 ENCOUNTER — Emergency Department (HOSPITAL_BASED_OUTPATIENT_CLINIC_OR_DEPARTMENT_OTHER): Payer: No Typology Code available for payment source

## 2021-04-29 ENCOUNTER — Emergency Department (HOSPITAL_BASED_OUTPATIENT_CLINIC_OR_DEPARTMENT_OTHER)
Admission: EM | Admit: 2021-04-29 | Discharge: 2021-04-29 | Disposition: A | Discharge status: Home / Self Care | Payer: No Typology Code available for payment source | Attending: Emergency Medicine | Admitting: Emergency Medicine

## 2021-04-29 DIAGNOSIS — Z7984 Long term (current) use of oral hypoglycemic drugs: Secondary | ICD-10-CM | POA: Insufficient documentation

## 2021-04-29 DIAGNOSIS — Z8551 Personal history of malignant neoplasm of bladder: Secondary | ICD-10-CM | POA: Insufficient documentation

## 2021-04-29 DIAGNOSIS — Z79899 Other long term (current) drug therapy: Secondary | ICD-10-CM | POA: Diagnosis not present

## 2021-04-29 DIAGNOSIS — Z7982 Long term (current) use of aspirin: Secondary | ICD-10-CM | POA: Insufficient documentation

## 2021-04-29 DIAGNOSIS — I10 Essential (primary) hypertension: Secondary | ICD-10-CM | POA: Insufficient documentation

## 2021-04-29 DIAGNOSIS — R5383 Other fatigue: Secondary | ICD-10-CM | POA: Diagnosis not present

## 2021-04-29 DIAGNOSIS — R000 Tachycardia, unspecified: Secondary | ICD-10-CM | POA: Diagnosis not present

## 2021-04-29 DIAGNOSIS — R079 Chest pain, unspecified: Secondary | ICD-10-CM | POA: Insufficient documentation

## 2021-04-29 DIAGNOSIS — Z87891 Personal history of nicotine dependence: Secondary | ICD-10-CM | POA: Insufficient documentation

## 2021-04-29 DIAGNOSIS — E119 Type 2 diabetes mellitus without complications: Secondary | ICD-10-CM | POA: Insufficient documentation

## 2021-04-29 DIAGNOSIS — F419 Anxiety disorder, unspecified: Secondary | ICD-10-CM

## 2021-04-29 DIAGNOSIS — R531 Weakness: Secondary | ICD-10-CM | POA: Diagnosis not present

## 2021-04-29 DIAGNOSIS — Z794 Long term (current) use of insulin: Secondary | ICD-10-CM | POA: Insufficient documentation

## 2021-04-29 LAB — BASIC METABOLIC PANEL
Anion gap: 12 (ref 5–15)
BUN: 17 mg/dL (ref 8–23)
CO2: 22 mmol/L (ref 22–32)
Calcium: 9.9 mg/dL (ref 8.9–10.3)
Chloride: 101 mmol/L (ref 98–111)
Creatinine, Ser: 0.93 mg/dL (ref 0.61–1.24)
GFR, Estimated: >60 mL/min (ref >60)
Glucose, Bld: 266 mg/dL — ABNORMAL HIGH (ref 70–99)
Potassium: 4.1 mmol/L (ref 3.5–5.1)
Sodium: 135 mmol/L (ref 135–145)

## 2021-04-29 LAB — CBC
HCT: 47.6 % (ref 39.0–52.0)
Hemoglobin: 16.3 g/dL (ref 13.0–17.0)
MCH: 28.9 pg (ref 26.0–34.0)
MCHC: 34.2 g/dL (ref 30.0–36.0)
MCV: 84.4 fL (ref 80.0–100.0)
Platelets: 352 10*3/uL (ref 150–400)
RBC: 5.64 MIL/uL (ref 4.22–5.81)
RDW: 12.9 % (ref 11.5–15.5)
WBC: 10.7 10*3/uL — ABNORMAL HIGH (ref 4.0–10.5)
nRBC: 0 % (ref 0.0–0.2)

## 2021-04-29 LAB — TROPONIN I (HIGH SENSITIVITY)
Troponin I (High Sensitivity): 4 ng/L (ref <18)
Troponin I (High Sensitivity): 4 ng/L (ref <18)

## 2021-04-29 MED ORDER — LORAZEPAM 0.5 MG PO TABS: 0.5000 mg | ORAL_TABLET | Freq: Two times a day (BID) | ORAL | 0 refills | Status: DC | PRN

## 2021-04-29 MED ORDER — ASPIRIN 81 MG PO CHEW: 324.0000 mg | CHEWABLE_TABLET | Freq: Once | ORAL | Status: DC

## 2021-04-29 MED ORDER — LORAZEPAM 1 MG PO TABS
1.0000 mg | ORAL_TABLET | Freq: Once | ORAL | Status: AC
  Administered 2021-04-29: 1 mg via ORAL
  Filled 2021-04-29: qty 1

## 2021-04-29 NOTE — ED Provider Notes (Signed)
[facility]GENCY DEPT Provider Note   CSN: [id number] Arrival date & time: 04/29/21  1414     History Chief Complaint  Patient presents with   Chest Pain    Mid left side chest pain x 1 hour.  Off and on    Shaun Pittman is a 65 y.o. male.  Presents to ER with chief complaint of chest pain.  Patient reports that about an hour prior to arrival he had an episode of dull aching pain on the mid to left side of the sternum.  Was constant.  Not associated with exertion, occurring at rest.  Mild to moderate.  That resolved around the time that he got to the emergency room.  States that over the last few days he has felt generally weak and fatigued.  Occurring both arms and legs, both left and right.  He denies any history of CAD.  Denies any back pain or abdominal pain.  Took full aspirin prior to arrival.  No pain at present.  Per review of chart patient has a history of diabetes, hypertension, AAA.  HPI     Past Medical History:  Diagnosis Date   AAA (abdominal aortic aneurysm) ([place])    Bladder cancer ([place])    has ileostomy   Diabetes mellitus without complication ([place])    Hypertension     Patient Active Problem List   Diagnosis Date Noted   Sepsis due to undetermined organism ([place]) 06/04/2020   Hypertension    Diabetes mellitus without complication ([place])    Bladder cancer ([place])    AAA (abdominal aortic aneurysm) ([place])    Splenic lesion 11/18/2019    Past Surgical History:  Procedure Laterality Date   BLADDER SURGERY     HERNIA REPAIR     ILEOSTOMY         History reviewed. No pertinent family history.  Social History   Tobacco Use   Smoking status: Former    Packs/day: 1.00    Years: 40.00    Pack years: 40.00    Types: Cigarettes    Quit date: 06/13/2020    Years since quitting: 0.8   Smokeless tobacco: Never   Tobacco comments:    down to 1/3-1/2 pack  Substance Use Topics   Alcohol use: Never   Drug use: Never    Home  Medications Prior to Admission medications   Medication Sig Start Date End Date Taking? Authorizing Provider  amLODipine (NORVASC) 2.5 MG tablet Take 2.5 mg by mouth daily.    Yes [provider]  aspirin EC 81 MG tablet Take 81 mg by mouth daily. Swallow whole.   Yes [provider]  cholecalciferol (VITAMIN D3) 25 MCG (1000 UNIT) tablet Take 1,000 Units by mouth daily.   Yes [provider]  ibuprofen (ADVIL) 200 MG tablet Take 200 mg by mouth every 6 (six) hours as needed for moderate pain.   Yes [provider]  insulin glargine (LANTUS) 100 UNIT/ML injection Inject 30 Units into the skin daily.   Yes [provider]  magnesium 30 MG tablet Take 30 mg by mouth daily.   Yes [provider]  metFORMIN (GLUCOPHAGE-XR) 500 MG 24 hr tablet Take 1,000 mg by mouth 2 (two) times daily after a meal.   Yes [provider]  Multiple Vitamin (MULTIVITAMIN) tablet Take 1 tablet by mouth daily. Auto Immune Multi Vit for Diabetic's   Yes [provider]  Omega-3 Fatty Acids (FISH OIL) 1200 MG CAPS Take 1  capsule by mouth daily.   Yes [provider]  Potassium 75 MG TABS Take 75 mg by mouth daily. Takes standard otc potassium   Yes [provider]  vitamin B-12 (CYANOCOBALAMIN) 100 MCG tablet Take 100 mcg by mouth daily.   Yes [provider]  doxycycline (VIBRA-TABS) 100 MG tablet Take 1 tablet (100 mg total) by mouth 2 (two) times daily. 12/13/20   Dorie Rank, MD  doxycycline (VIBRA-TABS) 100 MG tablet TAKE 1 TABLET BY MOUTH TWICE DAILY Patient not taking: Reported on 04/29/2021 12/13/20 12/13/21  Dorie Rank, MD    Allergies    Zithromax [azithromycin] and Sulfa antibiotics  Review of Systems   Review of Systems  Constitutional:  Negative for chills and fever.  HENT:  Negative for ear pain and sore throat.   Eyes:  Negative for pain and visual disturbance.  Respiratory:  Negative for cough and shortness of  breath.   Cardiovascular:  Negative for chest pain and palpitations.  Gastrointestinal:  Negative for abdominal pain and vomiting.  Genitourinary:  Negative for dysuria and hematuria.  Musculoskeletal:  Negative for arthralgias and back pain.  Skin:  Negative for color change and rash.  Neurological:  Negative for seizures and syncope.  All other systems reviewed and are negative.  Physical Exam Updated Vital Signs BP (!) 173/91 (BP Location: Right Arm)   Pulse (!) 106   Temp 98.1 F (36.7 C) (Oral)   Resp 14   Ht 6' (1.829 m)   Wt 83.9 kg   SpO2 97%   BMI 25.09 kg/m   Physical Exam  ED Results / Procedures / Treatments   Labs (all labs ordered are listed, but only abnormal results are displayed) Labs Reviewed  CBC - Abnormal; Notable for the following components:      Result Value   WBC 10.7 (*)    All other components within normal limits  BASIC METABOLIC PANEL  TROPONIN I (HIGH SENSITIVITY)    EKG EKG Interpretation  Date/Time:  Tuesday April 29 2021 14:21:34 EDT Ventricular Rate:  102 PR Interval:  172 QRS Duration: 74 QT Interval:  311 QTC Calculation: 405 R Axis:   66 Text Interpretation: Sinus tachycardia Confirmed by Madalyn Rob [phone]) on 04/29/2021 2:25:42 PM  Radiology No results found.  Procedures Procedures   Medications Ordered in ED Medications - No data to display  ED Course  I have reviewed the triage vital signs and the nursing notes.  Pertinent labs & imaging results that were available during my care of the patient were reviewed by me and considered in my medical decision making (see chart for details).    MDM Rules/Calculators/A&P                           65 year old male presenting to ER with concern for chest pain.  On exam patient well-appearing in no distress.  Noted mild sinus tachycardia but otherwise stable vitals.  EKG without obvious ischemic changes.  Will check basic labs, will need at minimum serial troponin.   While awaiting further work-up and observation, signed out to Dr. Gilford Raid.    Final Clinical Impression(s) / ED Diagnoses Final diagnoses:  Chest pain, unspecified type    Rx / DC Orders ED Discharge Orders     None        Lucrezia Starch, MD 04/29/21 1455

## 2021-04-29 NOTE — ED Triage Notes (Addendum)
Onset about one hour ago of mid to left of sternum dull ache  Constant pain.  Denies nausea but has SOB with walking

## 2021-04-29 NOTE — ED Notes (Signed)
CXR at bedside

## 2021-04-29 NOTE — ED Provider Notes (Signed)
Pt signed out by Dr. Roslynn Amble pending labs.  Pt's labs are unremarkable.  2 troponins neg.  Pt feels much better after the ativan.  Pt is stable for d/c.  He knows to return if worse.    Isla Pence, MD 04/29/21 1725

## 2021-10-25 ENCOUNTER — Inpatient Hospital Stay (HOSPITAL_COMMUNITY)
Admission: EM | Admit: 2021-10-25 | Discharge: 2021-10-28 | DRG: 389 | Disposition: A | Discharge status: Home / Self Care | Payer: No Typology Code available for payment source | Attending: Internal Medicine | Admitting: Internal Medicine

## 2021-10-25 ENCOUNTER — Inpatient Hospital Stay (HOSPITAL_COMMUNITY): Payer: No Typology Code available for payment source

## 2021-10-25 ENCOUNTER — Emergency Department (HOSPITAL_COMMUNITY): Payer: No Typology Code available for payment source

## 2021-10-25 ENCOUNTER — Encounter (HOSPITAL_COMMUNITY): Payer: Self-pay

## 2021-10-25 DIAGNOSIS — I714 Abdominal aortic aneurysm, without rupture, unspecified: Secondary | ICD-10-CM | POA: Diagnosis present

## 2021-10-25 DIAGNOSIS — J8410 Pulmonary fibrosis, unspecified: Secondary | ICD-10-CM | POA: Diagnosis present

## 2021-10-25 DIAGNOSIS — Z9079 Acquired absence of other genital organ(s): Secondary | ICD-10-CM

## 2021-10-25 DIAGNOSIS — I7143 Infrarenal abdominal aortic aneurysm, without rupture: Secondary | ICD-10-CM | POA: Diagnosis present

## 2021-10-25 DIAGNOSIS — Z794 Long term (current) use of insulin: Secondary | ICD-10-CM | POA: Diagnosis not present

## 2021-10-25 DIAGNOSIS — E781 Pure hyperglyceridemia: Secondary | ICD-10-CM | POA: Diagnosis present

## 2021-10-25 DIAGNOSIS — Z87891 Personal history of nicotine dependence: Secondary | ICD-10-CM | POA: Diagnosis not present

## 2021-10-25 DIAGNOSIS — J439 Emphysema, unspecified: Secondary | ICD-10-CM

## 2021-10-25 DIAGNOSIS — L03818 Cellulitis of other sites: Secondary | ICD-10-CM | POA: Diagnosis not present

## 2021-10-25 DIAGNOSIS — Z79899 Other long term (current) drug therapy: Secondary | ICD-10-CM | POA: Diagnosis not present

## 2021-10-25 DIAGNOSIS — L0390 Cellulitis, unspecified: Secondary | ICD-10-CM | POA: Diagnosis present

## 2021-10-25 DIAGNOSIS — I2510 Atherosclerotic heart disease of native coronary artery without angina pectoris: Secondary | ICD-10-CM | POA: Insufficient documentation

## 2021-10-25 DIAGNOSIS — E041 Nontoxic single thyroid nodule: Secondary | ICD-10-CM | POA: Insufficient documentation

## 2021-10-25 DIAGNOSIS — K5909 Other constipation: Secondary | ICD-10-CM | POA: Diagnosis present

## 2021-10-25 DIAGNOSIS — Z9049 Acquired absence of other specified parts of digestive tract: Secondary | ICD-10-CM | POA: Diagnosis not present

## 2021-10-25 DIAGNOSIS — R809 Proteinuria, unspecified: Secondary | ICD-10-CM | POA: Insufficient documentation

## 2021-10-25 DIAGNOSIS — K565 Intestinal adhesions [bands], unspecified as to partial versus complete obstruction: Secondary | ICD-10-CM | POA: Diagnosis present

## 2021-10-25 DIAGNOSIS — E1165 Type 2 diabetes mellitus with hyperglycemia: Secondary | ICD-10-CM | POA: Diagnosis present

## 2021-10-25 DIAGNOSIS — K56609 Unspecified intestinal obstruction, unspecified as to partial versus complete obstruction: Principal | ICD-10-CM

## 2021-10-25 DIAGNOSIS — I700 Atherosclerosis of aorta: Secondary | ICD-10-CM | POA: Diagnosis present

## 2021-10-25 DIAGNOSIS — K435 Parastomal hernia without obstruction or  gangrene: Secondary | ICD-10-CM | POA: Diagnosis present

## 2021-10-25 DIAGNOSIS — Z20822 Contact with and (suspected) exposure to covid-19: Secondary | ICD-10-CM | POA: Diagnosis present

## 2021-10-25 DIAGNOSIS — I10 Essential (primary) hypertension: Secondary | ICD-10-CM | POA: Diagnosis present

## 2021-10-25 DIAGNOSIS — R109 Unspecified abdominal pain: Secondary | ICD-10-CM | POA: Diagnosis present

## 2021-10-25 DIAGNOSIS — Z7984 Long term (current) use of oral hypoglycemic drugs: Secondary | ICD-10-CM

## 2021-10-25 DIAGNOSIS — I2584 Coronary atherosclerosis due to calcified coronary lesion: Secondary | ICD-10-CM | POA: Insufficient documentation

## 2021-10-25 DIAGNOSIS — L03311 Cellulitis of abdominal wall: Secondary | ICD-10-CM | POA: Diagnosis present

## 2021-10-25 DIAGNOSIS — Z0189 Encounter for other specified special examinations: Secondary | ICD-10-CM

## 2021-10-25 DIAGNOSIS — Z8551 Personal history of malignant neoplasm of bladder: Secondary | ICD-10-CM

## 2021-10-25 DIAGNOSIS — Z7982 Long term (current) use of aspirin: Secondary | ICD-10-CM | POA: Diagnosis not present

## 2021-10-25 DIAGNOSIS — K5792 Diverticulitis of intestine, part unspecified, without perforation or abscess without bleeding: Secondary | ICD-10-CM

## 2021-10-25 DIAGNOSIS — D72829 Elevated white blood cell count, unspecified: Secondary | ICD-10-CM | POA: Diagnosis present

## 2021-10-25 HISTORY — DX: Diverticulitis of intestine, part unspecified, without perforation or abscess without bleeding: K57.92

## 2021-10-25 HISTORY — DX: Pulmonary fibrosis, unspecified: J84.10

## 2021-10-25 HISTORY — DX: Pure hyperglyceridemia: E78.1

## 2021-10-25 HISTORY — DX: Nontoxic single thyroid nodule: E04.1

## 2021-10-25 HISTORY — DX: Emphysema, unspecified: J43.9

## 2021-10-25 LAB — CBC WITH DIFFERENTIAL/PLATELET
Abs Immature Granulocytes: 0.11 10*3/uL — ABNORMAL HIGH (ref 0.00–0.07)
Basophils Absolute: 0.1 10*3/uL (ref 0.0–0.1)
Basophils Relative: 1 %
Eosinophils Absolute: 0.3 10*3/uL (ref 0.0–0.5)
Eosinophils Relative: 2 %
HCT: 47.2 % (ref 39.0–52.0)
Hemoglobin: 16.5 g/dL (ref 13.0–17.0)
Immature Granulocytes: 1 %
Lymphocytes Relative: 7 %
Lymphs Abs: 1.3 10*3/uL (ref 0.7–4.0)
MCH: 29.5 pg (ref 26.0–34.0)
MCHC: 35 g/dL (ref 30.0–36.0)
MCV: 84.4 fL (ref 80.0–100.0)
Monocytes Absolute: 1.6 10*3/uL — ABNORMAL HIGH (ref 0.1–1.0)
Monocytes Relative: 9 %
Neutro Abs: 15.4 10*3/uL — ABNORMAL HIGH (ref 1.7–7.7)
Neutrophils Relative %: 80 %
Platelets: 392 10*3/uL (ref 150–400)
RBC: 5.59 MIL/uL (ref 4.22–5.81)
RDW: 13 % (ref 11.5–15.5)
WBC: 18.9 10*3/uL — ABNORMAL HIGH (ref 4.0–10.5)
nRBC: 0 % (ref 0.0–0.2)

## 2021-10-25 LAB — URINALYSIS, ROUTINE W REFLEX MICROSCOPIC
Bilirubin Urine: NEGATIVE
Glucose, UA: NEGATIVE mg/dL
Ketones, ur: NEGATIVE mg/dL
Nitrite: NEGATIVE
Protein, ur: 100 mg/dL — AB
Specific Gravity, Urine: 1.011 (ref 1.005–1.030)
pH: 6 (ref 5.0–8.0)

## 2021-10-25 LAB — RESP PANEL BY RT-PCR (FLU A&B, COVID) ARPGX2
Influenza A by PCR: NEGATIVE
Influenza B by PCR: NEGATIVE
SARS Coronavirus 2 by RT PCR: NEGATIVE

## 2021-10-25 LAB — COMPREHENSIVE METABOLIC PANEL
ALT: 41 U/L (ref 0–44)
AST: 33 U/L (ref 15–41)
Albumin: 4.3 g/dL (ref 3.5–5.0)
Alkaline Phosphatase: 75 U/L (ref 38–126)
Anion gap: 12 (ref 5–15)
BUN: 20 mg/dL (ref 8–23)
CO2: 22 mmol/L (ref 22–32)
Calcium: 9.6 mg/dL (ref 8.9–10.3)
Chloride: 100 mmol/L (ref 98–111)
Creatinine, Ser: 0.84 mg/dL (ref 0.61–1.24)
GFR, Estimated: >60 mL/min (ref >60)
Glucose, Bld: 296 mg/dL — ABNORMAL HIGH (ref 70–99)
Potassium: 4 mmol/L (ref 3.5–5.1)
Sodium: 134 mmol/L — ABNORMAL LOW (ref 135–145)
Total Bilirubin: 0.7 mg/dL (ref 0.3–1.2)
Total Protein: 7.8 g/dL (ref 6.5–8.1)

## 2021-10-25 LAB — MAGNESIUM
Magnesium: 1.4 mg/dL — ABNORMAL LOW (ref 1.7–2.4)
Magnesium: 1.6 mg/dL — ABNORMAL LOW (ref 1.7–2.4)

## 2021-10-25 LAB — LIPASE, BLOOD: Lipase: 49 U/L (ref 11–51)

## 2021-10-25 MED ORDER — CEFAZOLIN SODIUM-DEXTROSE 1-4 GM/50ML-% IV SOLN
1.0000 g | Freq: Three times a day (TID) | INTRAVENOUS | Status: DC
  Administered 2021-10-25 – 2021-10-28 (×8): 1 g via INTRAVENOUS
  Filled 2021-10-25 (×10): qty 50

## 2021-10-25 MED ORDER — ACETAMINOPHEN 650 MG RE SUPP: 650.0000 mg | Freq: Four times a day (QID) | RECTAL | Status: DC | PRN

## 2021-10-25 MED ORDER — ACETAMINOPHEN 325 MG PO TABS: 650.0000 mg | ORAL_TABLET | Freq: Four times a day (QID) | ORAL | Status: DC | PRN

## 2021-10-25 MED ORDER — MORPHINE SULFATE (PF) 2 MG/ML IV SOLN
2.0000 mg | INTRAVENOUS | Status: DC | PRN
  Administered 2021-10-25 (×2): 2 mg via INTRAVENOUS
  Filled 2021-10-25 (×2): qty 1

## 2021-10-25 MED ORDER — SODIUM CHLORIDE 0.9 % IV BOLUS
1000.0000 mL | Freq: Once | INTRAVENOUS | Status: AC
  Administered 2021-10-25: 1000 mL via INTRAVENOUS

## 2021-10-25 MED ORDER — PANTOPRAZOLE SODIUM 40 MG IV SOLR
40.0000 mg | INTRAVENOUS | Status: DC
  Administered 2021-10-25 – 2021-10-26 (×2): 40 mg via INTRAVENOUS
  Filled 2021-10-25 (×2): qty 10

## 2021-10-25 MED ORDER — MORPHINE SULFATE (PF) 4 MG/ML IV SOLN
4.0000 mg | Freq: Once | INTRAVENOUS | Status: AC
  Administered 2021-10-25: 4 mg via INTRAVENOUS
  Filled 2021-10-25: qty 1

## 2021-10-25 MED ORDER — DIATRIZOATE MEGLUMINE & SODIUM 66-10 % PO SOLN
90.0000 mL | Freq: Once | ORAL | Status: AC
  Administered 2021-10-25: 90 mL via NASOGASTRIC
  Filled 2021-10-25: qty 90

## 2021-10-25 MED ORDER — LIDOCAINE HCL URETHRAL/MUCOSAL 2 % EX GEL
1.0000 "application " | Freq: Once | CUTANEOUS | Status: AC
  Administered 2021-10-25: 1
  Filled 2021-10-25: qty 11

## 2021-10-25 MED ORDER — SODIUM CHLORIDE 0.9 % IV SOLN: INTRAVENOUS | Status: DC

## 2021-10-25 MED ORDER — ONDANSETRON HCL 4 MG PO TABS: 4.0000 mg | ORAL_TABLET | Freq: Four times a day (QID) | ORAL | Status: DC | PRN

## 2021-10-25 MED ORDER — METOPROLOL TARTRATE 5 MG/5ML IV SOLN
5.0000 mg | Freq: Three times a day (TID) | INTRAVENOUS | Status: DC
  Administered 2021-10-25 – 2021-10-27 (×6): 5 mg via INTRAVENOUS
  Filled 2021-10-25 (×6): qty 5

## 2021-10-25 MED ORDER — ONDANSETRON HCL 4 MG/2ML IJ SOLN
4.0000 mg | Freq: Four times a day (QID) | INTRAMUSCULAR | Status: DC | PRN
  Administered 2021-10-25: 4 mg via INTRAVENOUS
  Filled 2021-10-25: qty 2

## 2021-10-25 MED ORDER — LORAZEPAM 2 MG/ML IJ SOLN
1.0000 mg | Freq: Once | INTRAMUSCULAR | Status: AC
  Administered 2021-10-25: 1 mg via INTRAVENOUS
  Filled 2021-10-25: qty 1

## 2021-10-25 NOTE — H&P (Signed)
History and Physical    Patient: Shaun Pittman XTG:[id number] DOB: 04-06-1956 DOA: 10/25/2021 DOS: the patient was seen and examined on 10/25/2021 PCP: [facility]  Patient coming from: Home  Chief Complaint:  Chief Complaint  Patient presents with   Abdominal Pain    HPI: Shaun Pittman is a 66 y.o. 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  He initially thought he was diverticulitis since he ate a lot of peanuts yesterday.  He has been constipated since he has not been using saline on fibers recently.  No flank pain, diarrhea, melena or hematochezia.  No fever, chills, night sweats, rhinorrhea, sore throat, dyspnea, wheezing or hemoptysis.  No flank pain, dysuria, frequency or hematuria.  Denies chest pain, palpitations, diaphoresis, PND, orthopnea or pitting edema of the lower extremities.  No polyuria, polydipsia, polyphagia or blurred vision.  ED course: Initial vital signs were temperature 97.8 F, pulse 131, respirations 17, BP 160/96 mmHg and O2 sat 95% on room air.  The patient received 1000 mL of normal saline bolus, morphine 4 mg IVP x2, lorazepam with lidocaine jelly for NGT placement.  He was seen by Dr. Barry Dienes from general surgery.  Lab work: His urinalysis shows small hemoglobinuria and trace leukocyte esterase.  There was proteinuria of 100 mg/L, rare bacteria on microscopic examination.  CBC with a white count of 18.9 with 80% neutrophils, hemoglobin 16.5 g/dL platelets 392.  CMP showed a glucose of 296 mg/dL, the rest of the CMP values are normal after sodium is corrected.  Lipase was  normal.  Imaging: CT renal stone study showed entrapped small bowel loops in his peristomal hernia, coronary calcifications, aortic atherosclerosis, AAA,  Review of Systems: As mentioned in the history of present illness. All other systems reviewed and are negative. Past Medical History:  Diagnosis Date   AAA (abdominal aortic aneurysm)    Bladder cancer ([place])    has ileostomy   Diabetes mellitus without complication ([place])    Diverticulitis 10/25/2021   Hypertension    Pulmonary emphysema ([place]) 10/25/2021   Pulmonary fibrosis ([place]) 10/25/2021   Pure hypertriglyceridemia 10/25/2021   Thyroid nodule 10/25/2021   Past Surgical History:  Procedure Laterality Date   BLADDER SURGERY     HERNIA REPAIR     ILEOSTOMY     Social History:  reports that he quit smoking about 16 months ago. He has a 40.00 pack-year smoking history. He has never used smokeless tobacco. He reports that he does not drink alcohol and does not use drugs.  Allergies  Allergen Reactions   Zithromax [Azithromycin]     rash   Sulfa Antibiotics Rash    rash rash    History reviewed. No pertinent family history.  Prior to Admission medications   Medication Sig Start Date End Date Taking? Authorizing Provider  amLODipine (NORVASC) 2.5 MG tablet Take 2.5 mg by mouth daily.     [provider]  aspirin EC 81 MG tablet Take 81 mg by mouth daily. Swallow whole.    [provider]  cholecalciferol (VITAMIN D3) 25 MCG (1000 UNIT) tablet Take 1,000 Units by mouth daily.    [provider]  doxycycline (VIBRA-TABS) 100 MG tablet  Take 1 tablet (100 mg total) by mouth 2 (two) times daily. 12/13/20   Dorie Rank, MD  doxycycline (VIBRA-TABS) 100 MG tablet TAKE 1 TABLET BY MOUTH TWICE DAILY Patient not taking: Reported on 04/29/2021 12/13/20 12/13/21  Dorie Rank, MD  ibuprofen (ADVIL) 200 MG tablet Take 200 mg by mouth every 6 (six) hours as needed for moderate pain.    [provider]  insulin  glargine (LANTUS) 100 UNIT/ML injection Inject 30 Units into the skin daily.    [provider]  LORazepam (ATIVAN) 0.5 MG tablet Take 1 tablet (0.5 mg total) by mouth 2 (two) times daily as needed for anxiety. 04/29/21   Isla Pence, MD  magnesium 30 MG tablet Take 30 mg by mouth daily.    [provider]  metFORMIN (GLUCOPHAGE-XR) 500 MG 24 hr tablet Take 1,000 mg by mouth 2 (two) times daily after a meal.    [provider]  Multiple Vitamin (MULTIVITAMIN) tablet Take 1 tablet by mouth daily. Auto Immune Multi Vit for Diabetic's    [provider]  Omega-3 Fatty Acids (FISH OIL) 1200 MG CAPS Take 1 capsule by mouth daily.    [provider]  Potassium 75 MG TABS Take 75 mg by mouth daily. Takes standard otc potassium    [provider]  vitamin B-12 (CYANOCOBALAMIN) 100 MCG tablet Take 100 mcg by mouth daily.    [provider]    Physical Exam: Vitals:   10/25/21 0759 10/25/21 0930 10/25/21 1000 10/25/21 1100  BP:  (!) 157/93 (!) 146/115 137/90  Pulse:  (!) 113 (!) 113 (!) 113  Resp:  17 18 19   Temp:      TempSrc:      SpO2:  92% 95% 93%  Weight: 83.9 kg     Height: 6' (1.829 m)      Physical Exam Constitutional:      Appearance: He is well-developed.  HENT:     Head: Normocephalic and atraumatic.  Eyes:     General: No scleral icterus.    Pupils: Pupils are equal, round, and reactive to light.  Cardiovascular:     Rate and Rhythm: Regular rhythm. Tachycardia present.  Pulmonary:     Effort: Pulmonary effort is normal.  Abdominal:     General: Abdomen is flat. There is distension.     Palpations: Abdomen is soft.     Tenderness: There is abdominal tenderness.  Musculoskeletal:     Cervical back: Neck supple.     Right lower leg: No edema.     Left lower leg: No edema.  Skin:    General: Skin is warm and dry.     Coloration: Skin is not jaundiced.  Neurological:     General: No focal deficit present.      Mental Status: He is alert and oriented to person, place, and time.  Psychiatric:        Mood and Affect: Mood normal.        Behavior: Behavior normal.   Data Reviewed:  There are no new results to review at this time.  Assessment and Plan: Principal Problem:   SBO (small bowel obstruction) (HCC) Admit to telemetry as/inpatient. Keep NPO. NGT with LI suction. Continue IV fluids. Analgesics as needed. Antiemetics as needed. Pantoprazole 40 mg IVP daily. Check magnesium level. CBC and CMP in in the morning. Follow-up abdominal x-ray in a.m. General surgery consult appreciated.  Active Problems:   Hypertension Currently n.p.o. Metoprolol 5 mg IVP  q 8 hr while NPO. Monitor blood pressure and heart rate.    Leukocytosis/Cellulitis No fever or chills. Being treated for cellulitis around stoma with Keflex.   Will switch to cefazolin 1 g q 8 hours while NPO.    Type 2 diabetes mellitus with hyperglycemia (HCC) Currently NPO. CBG monitoring every 6 hours.    Pulmonary emphysema ([place]) Supplemental oxygen as needed. Bronchodilators as needed.    Proteinuria In the setting of acute illness. Follow-up with primary physician.    Pure hypertriglyceridemia Stated he is intolerant to statins.    Coronary artery calcification Advised to follow-up with PCP.    Aortic atherosclerosis (HCC)   AAA (abdominal aortic aneurysm) Unable to tolerate statins Resume aspirin once cleared for oral intake Follow-up with primary care provider.    Advance Care Planning:   Code Status: Full Code   Consults: General surgery (Dr. Barry Dienes).  Family Communication:   Severity of Illness: The appropriate patient status for this patient is INPATIENT. Inpatient status is judged to be reasonable and necessary in order to provide the required intensity of service to ensure the patient's safety. The patient's presenting symptoms, physical exam findings, and initial radiographic and laboratory  data in the context of their chronic comorbidities is felt to place them at high risk for further clinical deterioration. Furthermore, it is not anticipated that the patient will be medically stable for discharge from the hospital within 2 midnights of admission.   * I certify that at the point of admission it is my clinical judgment that the patient will require inpatient hospital care spanning beyond 2 midnights from the point of admission due to high intensity of service, high risk for further deterioration and high frequency of surveillance required.*  Author: Reubin Milan, MD 10/25/2021 1:02 PM  For on call review [email or web address].   This document was prepared with Dragon voice examination software and may contain some unintended transcription errors.

## 2021-10-25 NOTE — Consult Note (Addendum)
Reason for Consult:SBO Referring Physician: [name] is an 66 y.o. male.  HPI:  Pt is a 66 yo M with h/o cystoprostatectomy and ileal conduit for bladder cancer.This was done at the [facility] in [place] 7 years ago.  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  He comes in today with moderate to severe pain that started this AM.  It felt like prior episodes of diverticulitis, but the location was a little different. He ate peanuts yesterday so he thought that was it.  He denies fever/chills.  He was having some nausea and dry heaves, but no emesis.   This time the pain was in the lower abdomen, but circled around to the periumbilical region.  He describes it going to the back.  He states that he typically takes metamucil every night, but hasn't been recently.    He denies passing gas today, but has had 2 small BMs.     Past Medical History:  Diagnosis Date   AAA (abdominal aortic aneurysm)    Bladder cancer ([place])    has ileostomy   Diabetes mellitus without complication ([place])    Hypertension     Past Surgical History:  Procedure Laterality Date   BLADDER SURGERY     HERNIA REPAIR     ILEOSTOMY      History reviewed. No pertinent family history.  Social History:  reports that he quit smoking about 16 months ago. He has a 40.00 pack-year smoking history. He has never used smokeless tobacco. He reports that he does not drink alcohol and does not use drugs.  Allergies:  Allergies  Allergen Reactions   Zithromax [Azithromycin]     rash   Sulfa Antibiotics Rash    rash rash    Medications:  amLODipine (NORVASC) 2.5 MG tablet aspirin EC 81 MG tablet cholecalciferol (VITAMIN D3) 25 MCG (1000 UNIT) tablet doxycycline (VIBRA-TABS) 100 MG tablet doxycycline (VIBRA-TABS) 100 MG tablet ibuprofen (ADVIL) 200 MG tablet insulin glargine (LANTUS) 100 UNIT/ML injection LORazepam (ATIVAN) 0.5 MG tablet magnesium 30 MG tablet metFORMIN (GLUCOPHAGE-XR)  500 MG 24 hr tablet Multiple Vitamin (MULTIVITAMIN) tablet Omega-3 Fatty Acids (FISH OIL) 1200 MG CAPS Potassium 75 MG TABS vitamin B-12 (CYANOCOBALAMIN) 100 MCG tablet   Results for orders placed or performed during the hospital encounter of 10/25/21 (from the past 48 hour(s))  CBC with Differential     Status: Abnormal   Collection Time: 10/25/21  8:10 AM  Result Value Ref Range   WBC 18.9 (H) 4.0 - 10.5 K/uL   RBC 5.59 4.22 - 5.81 MIL/uL   Hemoglobin 16.5 13.0 - 17.0 g/dL   HCT 47.2 39.0 - 52.0 %   MCV 84.4 80.0 - 100.0 fL   MCH 29.5 26.0 - 34.0 pg   MCHC 35.0 30.0 - 36.0 g/dL   RDW 13.0 11.5 - 15.5 %   Platelets 392 150 - 400 K/uL   nRBC 0.0 0.0 - 0.2 %   Neutrophils Relative % 80 %   Neutro Abs 15.4 (H) 1.7 - 7.7 K/uL   Lymphocytes Relative 7 %   Lymphs Abs 1.3 0.7 - 4.0 K/uL   Monocytes Relative 9 %   Monocytes Absolute 1.6 (H) 0.1 - 1.0 K/uL   Eosinophils Relative 2 %   Eosinophils Absolute 0.3 0.0 - 0.5 K/uL   Basophils Relative 1 %   Basophils Absolute 0.1 0.0 - 0.1 K/uL   Immature Granulocytes 1 %   Abs Immature Granulocytes 0.11 (H)  0.00 - 0.07 K/uL    Comment: Performed at [facility], [place] [address] [place], [place] [address]  Comprehensive metabolic panel     Status: Abnormal   Collection Time: 10/25/21  8:10 AM  Result Value Ref Range   Sodium 134 (L) 135 - 145 mmol/L   Potassium 4.0 3.5 - 5.1 mmol/L   Chloride 100 98 - 111 mmol/L   CO2 22 22 - 32 mmol/L   Glucose, Bld 296 (H) 70 - 99 mg/dL    Comment: Glucose reference range applies only to samples taken after fasting for at least 8 hours.   BUN 20 8 - 23 mg/dL   Creatinine, Ser 0.84 0.61 - 1.24 mg/dL   Calcium 9.6 8.9 - 10.3 mg/dL   Total Protein 7.8 6.5 - 8.1 g/dL   Albumin 4.3 3.5 - 5.0 g/dL   AST 33 15 - 41 U/L   ALT 41 0 - 44 U/L   Alkaline Phosphatase 75 38 - 126 U/L   Total Bilirubin 0.7 0.3 - 1.2 mg/dL   GFR, Estimated >60 >60 mL/min    Comment: (NOTE) Calculated using  the CKD-EPI Creatinine Equation (2021)    Anion gap 12 5 - 15    Comment: Performed at [facility], [place] [address] [place], [place] [address]  Lipase, blood     Status: None   Collection Time: 10/25/21  8:10 AM  Result Value Ref Range   Lipase 49 11 - 51 U/L    Comment: Performed at [facility], [place] [address], [place], [place] [address]  Urinalysis, Routine w reflex microscopic Ileostomy     Status: Abnormal   Collection Time: 10/25/21  8:10 AM  Result Value Ref Range   Color, Urine YELLOW YELLOW   APPearance CLEAR CLEAR   Specific Gravity, Urine 1.011 1.005 - 1.030   pH 6.0 5.0 - 8.0   Glucose, UA NEGATIVE NEGATIVE mg/dL   Hgb urine dipstick SMALL (A) NEGATIVE   Bilirubin Urine NEGATIVE NEGATIVE   Ketones, ur NEGATIVE NEGATIVE mg/dL   Protein, ur 100 (A) NEGATIVE mg/dL   Nitrite NEGATIVE NEGATIVE   Leukocytes,Ua TRACE (A) NEGATIVE   RBC / HPF 6-10 0 - 5 RBC/hpf   WBC, UA 21-50 0 - 5 WBC/hpf   Bacteria, UA RARE (A) NONE SEEN   Non Squamous Epithelial 0-5 (A) NONE SEEN    Comment: Performed at [facility], [place] [address] [place], [place] [address]  Resp Panel by RT-PCR (Flu A&B, Covid) Nasopharyngeal Swab     Status: None   Collection Time: 10/25/21 10:03 AM   Specimen: Nasopharyngeal Swab; Nasopharyngeal(NP) swabs in vial transport medium  Result Value Ref Range   SARS Coronavirus 2 by RT PCR NEGATIVE NEGATIVE    Comment: (NOTE) SARS-CoV-2 target nucleic acids are NOT DETECTED.  The SARS-CoV-2 RNA is generally detectable in upper respiratory specimens during the acute phase of infection. The lowest concentration of SARS-CoV-2 viral copies this assay can detect is 138 copies/mL. A negative result does not preclude SARS-Cov-2 infection and should not be used as the sole basis for treatment or other patient management decisions. A negative result may occur with  improper specimen collection/handling, submission of  specimen other than nasopharyngeal swab, presence of viral mutation(s) within the areas targeted by this assay, and inadequate number of viral copies(<138 copies/mL). A negative result must be combined with clinical observations, patient history, and epidemiological information. The expected result is Negative.  Fact Sheet for Patients:  EntrepreneurPulse.com.au  Fact Sheet for Healthcare Providers:  IncredibleEmployment.be  This test is no t yet approved or cleared by the [place] FDA and  has been authorized for detection and/or diagnosis of SARS-CoV-2 by FDA under an Emergency Use Authorization (EUA). This EUA will remain  in effect (meaning this test can be used) for the duration of the COVID-19 declaration under Section 564(b)(1) of the Act, 21 U.S.C.section 360bbb-3(b)(1), unless the authorization is terminated  or revoked sooner.       Influenza A by PCR NEGATIVE NEGATIVE   Influenza B by PCR NEGATIVE NEGATIVE    Comment: (NOTE) The Xpert Xpress SARS-CoV-2/FLU/RSV plus assay is intended as an aid in the diagnosis of influenza from Nasopharyngeal swab specimens and should not be used as a sole basis for treatment. Nasal washings and aspirates are unacceptable for Xpert Xpress SARS-CoV-2/FLU/RSV testing.  Fact Sheet for Patients: EntrepreneurPulse.com.au  Fact Sheet for Healthcare Providers: IncredibleEmployment.be  This test is not yet approved or cleared by the [place] FDA and has been authorized for detection and/or diagnosis of SARS-CoV-2 by FDA under an Emergency Use Authorization (EUA). This EUA will remain in effect (meaning this test can be used) for the duration of the COVID-19 declaration under Section 564(b)(1) of the Act, 21 U.S.C. section 360bbb-3(b)(1), unless the authorization is terminated or revoked.  Performed at [facility], [place] [address], [place], [place] [address]     CT Renal Stone Study  Result Date: 10/25/2021 CLINICAL DATA:  66 year old male with history of bilateral flank pain. EXAM: CT ABDOMEN AND PELVIS WITHOUT CONTRAST TECHNIQUE: Multidetector CT imaging of the abdomen and pelvis was performed following the standard protocol without IV contrast. RADIATION DOSE REDUCTION: This exam was performed according to the departmental dose-optimization program which includes automated exposure control, adjustment of the mA and/or kV according to patient size and/or use of iterative reconstruction technique. COMPARISON:  CT the abdomen and pelvis 06/03/2020. FINDINGS: Lower chest: Mild scarring in the lung bases. Atherosclerotic calcifications in the left anterior descending, left circumflex and right coronary arteries. Hepatobiliary: No suspicious cystic or solid hepatic lesions are confidently identified on today's noncontrast CT examination. Status post cholecystectomy. Pancreas: No definite pancreatic mass or peripancreatic fluid collections or inflammatory changes are noted on today's noncontrast CT examination. Spleen: Unremarkable. Adrenals/Urinary Tract: No calcifications are identified within the collecting system of either kidney or along the course of either ureter. In the posterolateral aspect of the right kidney (axial image 40 of series 2) there is a 1.2 cm intermediate attenuation lesion, stable compared to the prior examination, incompletely characterized on today's non-contrast CT examination, but statistically likely to represent a proteinaceous/hemorrhagic cyst. Unenhanced appearance of the left kidney and bilateral adrenal glands is normal. 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 Stomach/Bowel: Normal appearance of the stomach. There are multiple dilated loops of small bowel with air-fluid levels measuring up to 3.8 cm in diameter. Distal small bowel is decompressed beyond a parastomal  hernia which contains loops of mid to distal ileum. Colon also appears relatively decompressed. Scattered colonic diverticulae are noted, without surrounding inflammatory changes to suggest an acute diverticulitis at this time. Normal appendix. Vascular/Lymphatic: Atherosclerotic calcifications are noted in the abdominal aorta and pelvic vasculature. Fusiform aneurysmal dilatation of the infrarenal abdominal aorta measuring up to 3.4 x 3.2 cm (axial image 51 of series 2). Reproductive: Prostate gland is surgically absent. Other: No significant volume of ascites.  No pneumoperitoneum.  Musculoskeletal: There are no aggressive appearing lytic or blastic lesions noted in the visualized portions of the skeleton. IMPRESSION: 1. 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 On today's examination, there appears to be small-bowel obstruction likely entrapped small bowel loops in this peristomal hernia. Surgical consultation is recommended. 2. Aortic atherosclerosis, in addition to three-vessel coronary artery disease. Please note that although the presence of coronary artery calcium documents the presence of coronary artery disease, the severity of this disease and any potential stenosis cannot be assessed on this non-gated CT examination. Assessment for potential risk factor modification, dietary therapy or pharmacologic therapy may be warranted, if clinically indicated. 3. 3.4 x 3.2 cm infrarenal abdominal aortic aneurysm. Recommend follow-up ultrasound every 3 years. This recommendation follows ACR consensus guidelines: White Paper of the ACR Incidental Findings Committee II on Vascular Findings. J Am Coll Radiol 2013; 10:789-794. 4. Additional incidental findings, as above. Electronically Signed   By: Vinnie Langton M.D.   On: 10/25/2021 09:16    Review of Systems  Constitutional:  Negative for chills, fatigue and unexpected weight change.  HENT:  Negative.    Eyes: Negative.   Respiratory: Negative.    Cardiovascular: Negative.   Gastrointestinal:  Positive for abdominal distention (mild), abdominal pain, constipation and nausea. Negative for anal bleeding, blood in stool, diarrhea, rectal pain and vomiting.  Endocrine: Negative.   Genitourinary:        Ileal conduit  Musculoskeletal: Negative.   Skin: Negative.   Allergic/Immunologic: Negative.   Neurological: Negative.   Hematological: Negative.   Psychiatric/Behavioral: Negative.    All other systems reviewed and are negative.  Blood pressure (!) 146/115, pulse (!) 113, temperature 97.8 F (36.6 C), temperature source Oral, resp. rate 18, height 6' (1.829 m), weight 83.9 kg, SpO2 95 %.  Physical Exam Vitals reviewed.  Constitutional:      General: He is not in acute distress.    Appearance: He is well-developed and normal weight. He is not ill-appearing, toxic-appearing or diaphoretic.  HENT:     Head: Normocephalic and atraumatic.     Mouth/Throat:     Mouth: Mucous membranes are moist.  Eyes:     General: No scleral icterus.    Extraocular Movements: Extraocular movements intact.     Pupils: Pupils are equal, round, and reactive to light.  Cardiovascular:     Rate and Rhythm: Regular rhythm. Tachycardia present.  Pulmonary:     Effort: Pulmonary effort is normal. No respiratory distress.     Breath sounds: No stridor.  Chest:     Chest wall: No tenderness.  Abdominal:     General: There is distension (very mild). There are no signs of injury.     Palpations: Abdomen is soft. There is no fluid wave, hepatomegaly, splenomegaly or pulsatile mass.     Tenderness: There is no abdominal tenderness.     Hernia: A hernia (parastomal hernia. soft, reducible) is present.  Skin:    General: Skin is warm and dry.     Capillary Refill: Capillary refill takes 2 to 3 seconds.     Coloration: Skin is not cyanotic, jaundiced or pale.     Findings: No erythema or rash.      Comments: Slight flushing of face  Neurological:     General: No focal deficit present.     Mental Status: He is alert.     Motor: No weakness.  Psychiatric:  Mood and Affect: Mood normal. Mood is not anxious or depressed.     Comments: A bit talkative and rambling, (just got pain medication)     Assessment/Plan:  H/o bladder cancer with ileal conduit Longstanding parastomal hernia SBO DM with hyperglycemia AAA  It appears that the small bowel entering the hernia looks similar in caliber to the small bowel exiting the hernia.  But there are definitely decompressed loops of small bowel.  Will do NGT and small bowel protocol.   The leukocytosis does not go along with the SBO and timeline of symptoms.    Moderately complex decision making.    Milus Height, MD FACS Surgical Oncology, General Surgery, Trauma and [place] [facility], [place] for weekday/non holidays Check [email or web address] for coverage night/weekend/holidays  Do not use SecureChat as it is not reliable for timely patient care.

## 2021-10-25 NOTE — ED Notes (Signed)
Pt NAD in bed with NG tube placed on intermittent suction. A/ox4, pt states he was woken with 9/10 diffuse ABD pain this morning. Denies n/v/d or recent illness. Pain does not change on palpation. Pt states he has a known hernia.

## 2021-10-25 NOTE — ED Provider Notes (Signed)
[facility] DEPT Provider Note   CSN: [id number] Arrival date & time: 10/25/21  0745     History  Chief Complaint  Patient presents with   Abdominal Pain    Shaun Pittman is a 66 y.o. male.  Pt is a 66 yo wm with a hx of diverticulitis.  He has a small abdominal aneurysm, htn, and dm.  He had bladder cancer which required a radical cystoprostatectomy with RLQ ileal conduit about 7 years at the [place] in [place].  Pt ate a lot of peanuts yesterday and developed abd pain around 0400.  Pt denies any vomiting, but felt nauseous on the ambulance.  He was given 4 mg zofran en route.  Pt said pain radiates to his back.  No fever.                     Home Medications Prior to Admission medications   Medication Sig Start Date End Date Taking? Authorizing Provider  amLODipine (NORVASC) 2.5 MG tablet Take 2.5 mg by mouth daily.     [provider]  aspirin EC 81 MG tablet Take 81 mg by mouth daily. Swallow whole.    [provider]  cholecalciferol (VITAMIN D3) 25 MCG (1000 UNIT) tablet Take 1,000 Units by mouth daily.    [provider]  doxycycline (VIBRA-TABS) 100 MG tablet Take 1 tablet (100 mg total) by mouth 2 (two) times daily. 12/13/20   Dorie Rank, MD  doxycycline (VIBRA-TABS) 100 MG tablet TAKE 1 TABLET BY MOUTH TWICE DAILY Patient not taking: Reported on 04/29/2021 12/13/20 12/13/21  Dorie Rank, MD  ibuprofen (ADVIL) 200 MG tablet Take 200 mg by mouth every 6 (six) hours as needed for moderate pain.    [provider]  insulin glargine (LANTUS) 100 UNIT/ML injection Inject 30 Units into the skin daily.    [provider]  LORazepam (ATIVAN) 0.5 MG tablet Take 1 tablet (0.5 mg total) by mouth 2 (two) times daily as needed for anxiety. 04/29/21   Isla Pence, MD  magnesium 30 MG tablet Take 30 mg by mouth daily.    [provider]  metFORMIN (GLUCOPHAGE-XR) 500 MG 24 hr tablet Take 1,000 mg by mouth 2  (two) times daily after a meal.    [provider]  Multiple Vitamin (MULTIVITAMIN) tablet Take 1 tablet by mouth daily. Auto Immune Multi Vit for Diabetic's    [provider]  Omega-3 Fatty Acids (FISH OIL) 1200 MG CAPS Take 1 capsule by mouth daily.    [provider]  Potassium 75 MG TABS Take 75 mg by mouth daily. Takes standard otc potassium    [provider]  vitamin B-12 (CYANOCOBALAMIN) 100 MCG tablet Take 100 mcg by mouth daily.    [provider]      Allergies    Zithromax [azithromycin] and Sulfa antibiotics    Review of Systems   Review of Systems  Gastrointestinal:  Positive for abdominal pain and nausea.  All other systems reviewed and are negative.  Physical Exam Updated Vital Signs BP (!) 146/115    Pulse (!) 113    Temp 97.8 F (36.6 C) (Oral)    Resp 18    Ht 6' (1.829 m)    Wt 83.9 kg    SpO2 95%    BMI 25.09 kg/m  Physical Exam Vitals and nursing note reviewed.  Constitutional:      Appearance: He is well-developed.  HENT:  Head: Normocephalic and atraumatic.     Mouth/Throat:     Mouth: Mucous membranes are moist.     Pharynx: Oropharynx is clear.  Eyes:     Extraocular Movements: Extraocular movements intact.     Pupils: Pupils are equal, round, and reactive to light.  Cardiovascular:     Rate and Rhythm: Regular rhythm. Tachycardia present.  Pulmonary:     Effort: Pulmonary effort is normal.     Breath sounds: Normal breath sounds.  Abdominal:     General: Abdomen is flat. Bowel sounds are normal.     Palpations: Abdomen is soft.     Tenderness: There is generalized abdominal tenderness.     Comments: Urostomy bag noted  Skin:    Capillary Refill: Capillary refill takes less than 2 seconds.  Neurological:     General: No focal deficit present.     Mental Status: He is alert and oriented to person, place, and time.  Psychiatric:        Mood and Affect: Mood normal.        Behavior: Behavior  normal.    ED Results / Procedures / Treatments   Labs (all labs ordered are listed, but only abnormal results are displayed) Labs Reviewed  CBC WITH DIFFERENTIAL/PLATELET - Abnormal; Notable for the following components:      Result Value   WBC 18.9 (*)    Neutro Abs 15.4 (*)    Monocytes Absolute 1.6 (*)    Abs Immature Granulocytes 0.11 (*)    All other components within normal limits  COMPREHENSIVE METABOLIC PANEL - Abnormal; Notable for the following components:   Sodium 134 (*)    Glucose, Bld 296 (*)    All other components within normal limits  URINALYSIS, ROUTINE W REFLEX MICROSCOPIC - Abnormal; Notable for the following components:   Hgb urine dipstick SMALL (*)    Protein, ur 100 (*)    Leukocytes,Ua TRACE (*)    Bacteria, UA RARE (*)    Non Squamous Epithelial 0-5 (*)    All other components within normal limits  RESP PANEL BY RT-PCR (FLU A&B, COVID) ARPGX2  LIPASE, BLOOD    EKG None  Radiology CT Renal Stone Study  Result Date: 10/25/2021 CLINICAL DATA:  66 year old male with history of bilateral flank pain. EXAM: CT ABDOMEN AND PELVIS WITHOUT CONTRAST TECHNIQUE: Multidetector CT imaging of the abdomen and pelvis was performed following the standard protocol without IV contrast. RADIATION DOSE REDUCTION: This exam was performed according to the departmental dose-optimization program which includes automated exposure control, adjustment of the mA and/or kV according to patient size and/or use of iterative reconstruction technique. COMPARISON:  CT the abdomen and pelvis 06/03/2020. FINDINGS: Lower chest: Mild scarring in the lung bases. Atherosclerotic calcifications in the left anterior descending, left circumflex and right coronary arteries. Hepatobiliary: No suspicious cystic or solid hepatic lesions are confidently identified on today's noncontrast CT examination. Status post cholecystectomy. Pancreas: No definite pancreatic mass or peripancreatic fluid collections  or inflammatory changes are noted on today's noncontrast CT examination. Spleen: Unremarkable. Adrenals/Urinary Tract: No calcifications are identified within the collecting system of either kidney or along the course of either ureter. In the posterolateral aspect of the right kidney (axial image 40 of series 2) there is a 1.2 cm intermediate attenuation lesion, stable compared to the prior examination, incompletely characterized on today's non-contrast CT examination, but statistically likely to represent a proteinaceous/hemorrhagic cyst. Unenhanced appearance of the left kidney and bilateral adrenal glands is normal.  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 Stomach/Bowel: Normal appearance of the stomach. There are multiple dilated loops of small bowel with air-fluid levels measuring up to 3.8 cm in diameter. Distal small bowel is decompressed beyond a parastomal hernia which contains loops of mid to distal ileum. Colon also appears relatively decompressed. Scattered colonic diverticulae are noted, without surrounding inflammatory changes to suggest an acute diverticulitis at this time. Normal appendix. Vascular/Lymphatic: Atherosclerotic calcifications are noted in the abdominal aorta and pelvic vasculature. Fusiform aneurysmal dilatation of the infrarenal abdominal aorta measuring up to 3.4 x 3.2 cm (axial image 51 of series 2). Reproductive: Prostate gland is surgically absent. Other: No significant volume of ascites.  No pneumoperitoneum. Musculoskeletal: There are no aggressive appearing lytic or blastic lesions noted in the visualized portions of the skeleton. IMPRESSION: 1. 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 On today's examination, there appears to be small-bowel obstruction likely entrapped small bowel loops in this peristomal hernia. Surgical consultation is recommended. 2. Aortic  atherosclerosis, in addition to three-vessel coronary artery disease. Please note that although the presence of coronary artery calcium documents the presence of coronary artery disease, the severity of this disease and any potential stenosis cannot be assessed on this non-gated CT examination. Assessment for potential risk factor modification, dietary therapy or pharmacologic therapy may be warranted, if clinically indicated. 3. 3.4 x 3.2 cm infrarenal abdominal aortic aneurysm. Recommend follow-up ultrasound every 3 years. This recommendation follows ACR consensus guidelines: White Paper of the ACR Incidental Findings Committee II on Vascular Findings. J Am Coll Radiol 2013; 10:789-794. 4. Additional incidental findings, as above. Electronically Signed   By: Vinnie Langton M.D.   On: 10/25/2021 09:16    Procedures Procedures    Medications Ordered in ED Medications  morphine (PF) 4 MG/ML injection 4 mg (has no administration in time range)  LORazepam (ATIVAN) injection 1 mg (has no administration in time range)  morphine (PF) 4 MG/ML injection 4 mg (4 mg Intravenous Given 10/25/21 0835)  sodium chloride 0.9 % bolus 1,000 mL (1,000 mLs Intravenous New Bag/Given 10/25/21 4782)    ED Course/ Medical Decision Making/ A&P                           Medical Decision Making Amount and/or Complexity of Data Reviewed Labs: ordered. Radiology: ordered.  Risk Prescription drug management. Decision regarding hospitalization.   This patient presents to the ED for concern of abd pain, this involves an extensive number of treatment options, and is a complaint that carries with it a high risk of complications and morbidity.  The differential diagnosis includes diverticulitis, kidney stone, abdominal infection, sbo   Co morbidities that complicate the patient evaluation  Hx cystoprostatectomy   Additional history obtained:  Additional history obtained from epic chart review    Lab  Tests:  I Ordered, and personally interpreted labs.  The pertinent results include:  wbc is elevated.  Bs is elevated.  Urine with 21-50 wbcs, but it is from an ileal conduit.  Neg nitrites.  Unlikely source of pain.   Imaging Studies ordered:  I ordered imaging studies including ct abd/pelvis  I independently visualized and interpreted imaging which showed sbo I agree with the radiologist interpretation   Cardiac Monitoring:  The patient was maintained on a cardiac monitor.  I personally viewed and interpreted the cardiac monitored which showed an underlying rhythm of: sinus  tachy   Medicines ordered and prescription drug management:  I ordered medication including morphine  for pain  Reevaluation of the patient after these medicines showed that the patient improved I have reviewed the patients home medicines and have made adjustments as needed   Test Considered:  Ct scans   Critical Interventions:  Ng tube   Consultations Obtained:  I requested consultation with Dr. Barry Dienes (gen surg),  and discussed lab and imaging findings as well as pertinent plan - She recommends NG tube and they will consult.  She recommends hospitalist admission. Pt d/w Dr. Olevia Bowens ([facility]) for admission.   Problem List / ED Course:  Abd pain secondary to sbo.  NG placed Hyperglycemia:  NPO, so supplement with insulin and ivfs   Reevaluation:  After the interventions noted above, I reevaluated the patient and found that they have :improved   Social Determinants of Health:  Lives alone   Dispostion:  After consideration of the diagnostic results and the patients response to treatment, I feel that the patent would benefit from admission.          Final Clinical Impression(s) / ED Diagnoses Final diagnoses:  SBO (small bowel obstruction) ([place])    Rx / DC Orders ED Discharge Orders     None         Isla Pence, MD 10/25/21 1009

## 2021-10-25 NOTE — ED Triage Notes (Signed)
Pt BIBA from home for mid abdominal pain/cramping starting around 0400 this morning. Hx diverticulitis, ate a lot of peanuts last night but that pain is usually lower. No weakness, no issues with colostomy. Has stable abdominal aneurysm, checked last year. Some nausea. Denies fever, chills. Appetite fine, noted some decreased stool last day or do. Hx DM. 20ga LAC, 4mg  zofran, some relief of pain with that.  BP 150/90 HR 130 CBG 313

## 2021-10-25 NOTE — ED Notes (Signed)
Patient transported to CT 

## 2021-10-26 ENCOUNTER — Other Ambulatory Visit: Payer: Self-pay

## 2021-10-26 ENCOUNTER — Inpatient Hospital Stay (HOSPITAL_COMMUNITY): Payer: No Typology Code available for payment source

## 2021-10-26 DIAGNOSIS — I10 Essential (primary) hypertension: Secondary | ICD-10-CM

## 2021-10-26 DIAGNOSIS — L03311 Cellulitis of abdominal wall: Secondary | ICD-10-CM

## 2021-10-26 DIAGNOSIS — I714 Abdominal aortic aneurysm, without rupture, unspecified: Secondary | ICD-10-CM

## 2021-10-26 LAB — COMPREHENSIVE METABOLIC PANEL
ALT: 37 U/L (ref 0–44)
AST: 21 U/L (ref 15–41)
Albumin: 3.9 g/dL (ref 3.5–5.0)
Alkaline Phosphatase: 54 U/L (ref 38–126)
Anion gap: 11 (ref 5–15)
BUN: 22 mg/dL (ref 8–23)
CO2: 24 mmol/L (ref 22–32)
Calcium: 9.2 mg/dL (ref 8.9–10.3)
Chloride: 102 mmol/L (ref 98–111)
Creatinine, Ser: 0.99 mg/dL (ref 0.61–1.24)
GFR, Estimated: >60 mL/min (ref >60)
Glucose, Bld: 307 mg/dL — ABNORMAL HIGH (ref 70–99)
Potassium: 4.5 mmol/L (ref 3.5–5.1)
Sodium: 137 mmol/L (ref 135–145)
Total Bilirubin: 0.9 mg/dL (ref 0.3–1.2)
Total Protein: 7.1 g/dL (ref 6.5–8.1)

## 2021-10-26 LAB — CBC
HCT: 44.4 % (ref 39.0–52.0)
Hemoglobin: 15.1 g/dL (ref 13.0–17.0)
MCH: 29.4 pg (ref 26.0–34.0)
MCHC: 34 g/dL (ref 30.0–36.0)
MCV: 86.5 fL (ref 80.0–100.0)
Platelets: 360 10*3/uL (ref 150–400)
RBC: 5.13 MIL/uL (ref 4.22–5.81)
RDW: 13.3 % (ref 11.5–15.5)
WBC: 12 10*3/uL — ABNORMAL HIGH (ref 4.0–10.5)
nRBC: 0 % (ref 0.0–0.2)

## 2021-10-26 LAB — HEMOGLOBIN A1C
Hgb A1c MFr Bld: 7.7 % — ABNORMAL HIGH (ref 4.8–5.6)
Mean Plasma Glucose: 174.29 mg/dL

## 2021-10-26 LAB — GLUCOSE, CAPILLARY
Glucose-Capillary: 202 mg/dL — ABNORMAL HIGH (ref 70–99)
Glucose-Capillary: 204 mg/dL — ABNORMAL HIGH (ref 70–99)
Glucose-Capillary: 244 mg/dL — ABNORMAL HIGH (ref 70–99)
Glucose-Capillary: 267 mg/dL — ABNORMAL HIGH (ref 70–99)

## 2021-10-26 LAB — MAGNESIUM: Magnesium: 1.5 mg/dL — ABNORMAL LOW (ref 1.7–2.4)

## 2021-10-26 MED ORDER — INSULIN ASPART 100 UNIT/ML IJ SOLN
0.0000 [IU] | INTRAMUSCULAR | Status: DC
  Administered 2021-10-26: 3 [IU] via SUBCUTANEOUS
  Administered 2021-10-26: 5 [IU] via SUBCUTANEOUS
  Administered 2021-10-26 – 2021-10-27 (×2): 3 [IU] via SUBCUTANEOUS
  Administered 2021-10-27 (×2): 2 [IU] via SUBCUTANEOUS
  Administered 2021-10-27: 3 [IU] via SUBCUTANEOUS

## 2021-10-26 MED ORDER — BISACODYL 10 MG RE SUPP: 10.0000 mg | Freq: Once | RECTAL | Status: DC

## 2021-10-26 NOTE — Progress Notes (Signed)
[facility]                                                                               Shaun Pittman, is a 66 y.o. male, DOB - 1956-02-24, UUV:[id number] Admit date - 10/25/2021    Outpatient Primary MD for the patient is Clinic, [facility]  LOS - 1  days    Brief summary   Patient is a 66 year old male admitted history of C, history of radical cystoprostatectomy with right lower quadrant ileal conduit, type 2 diabetes mellitus, pulmonary emphysema/fibrosis, hypertriglyceridemia, presented to ED with abdominal pain, nausea but no emesis. In ED CBC showed white count of 18.9 with 80% neutrophils, hemoglobin 16.5, glucose 296 on CMP.  CT renal stone study showed entrapped small bowel loops in the peristomal hernia  Patient was admitted for further work-up   Assessment & Plan    Assessment and Plan: * SBO (small bowel obstruction) ([place])- (present on admission) - Presented with constipation, abdominal pain, nausea but no vomiting. -CT renal stone study showed entrapped small bowel loops in the parastomal hernia, coronary calcification, aortic atherosclerosis, AAA - NGT placed to intermittent wall suction, n.p.o., IV fluids. -Still no flatus or BM, will place Dulcolax suppository x1 -General surgery following, currently no indication for surgery  Leukocytosis- (present on admission) - Likely due to cellulitis around stoma, was being treated outpatient with Keflex -Continue IV cefazolin  Cellulitis- (present on admission) - Continue IV cefazolin, was being treated outpatient with oral Keflex  Type 2 diabetes mellitus with hyperglycemia ([place])- (present on admission) -  Placed on sliding scale insulin q4 hours while n.p.o. -Last hemoglobin A1c 9.1 in 05/2020  Hypertension- (present on admission) - Currently n.p.o., continue scheduled IV Lopressor  Pulmonary emphysema ([place])- (present on admission) - Currently no wheezing, stable  Pure hypertriglyceridemia-  (present on admission) - Currently n.p.o.   AAA (abdominal aortic aneurysm)- (present on admission) - Seen incidentally on CT, 3.4 x 2.2 cm AAA, recommended outpatient follow-up -Also noted atherosclerosis, three-vessel CAD on CT.  Outpatient follow-up with PCP and cardiology recommended.    Code Status: Full CODE STATUS DVT Prophylaxis:  SCDs Start: 10/25/21 1139   Level of Care: Level of care: Telemetry Family Communication: d/w patient  Disposition Plan:     Remains inpatient appropriate: Work-up in progress, still has SBO,  Procedures:  None  Consultants:   General surgery  Antimicrobials:   IV cefazolin 10/25/2021-->   Medications  Scheduled Meds:  bisacodyl  10 mg Rectal Once   insulin aspart  0-9 Units Subcutaneous Q4H   metoprolol tartrate  5 mg Intravenous Q8H   pantoprazole (PROTONIX) IV  40 mg Intravenous Q24H   Continuous Infusions:  sodium chloride 100 mL/hr at 10/26/21 0643    ceFAZolin (ANCEF) IV 1 g (10/26/21 0547)   PRN Meds:.acetaminophen **OR** acetaminophen, morphine injection, ondansetron **OR** ondansetron (ZOFRAN) IV    Subjective:   Shaun Pittman was seen and examined today.  No active nausea vomiting or abdominal pain.  Still has not passed flatus or BM.  No fevers.   No acute events overnight.    Objective:   Vitals:   10/25/21 2052 10/26/21 0031 10/26/21 0500  10/26/21 0831  BP: (!) 143/104 (!) 160/93 139/81 (!) 145/89  Pulse: 100 (!) 102 (!) 110 (!) 101  Resp: 20 20 15 20   Temp: 97.8 F (36.6 C) 98.4 F (36.9 C) 98.6 F (37 C) 98.3 F (36.8 C)  TempSrc: Oral Oral Oral Oral  SpO2: 93% 90% 90% (!) 88%  Weight:      Height:        Intake/Output Summary (Last 24 hours) at 10/26/2021 1129 Last data filed at 10/26/2021 5366 Gross per 24 hour  Intake 1441.05 ml  Output 3400 ml  Net -1958.95 ml   Filed Weights   10/25/21 0759  Weight: 83.9 kg     Exam General: Alert and oriented x 3, NAD, NGT Cardiovascular: S1 S2  auscultated, RRR Respiratory: Clear to auscultation bilaterally Gastrointestinal: Soft, nontender, + hypoactive BS, ileal conduit Ext: no pedal edema bilaterally Neuro: no new FND's   Data Reviewed:  I have personally reviewed following labs and imaging studies   CBC Lab Results  Component Value Date   WBC 12.0 (H) 10/26/2021   RBC 5.13 10/26/2021   HGB 15.1 10/26/2021   HCT 44.4 10/26/2021   MCV 86.5 10/26/2021   MCH 29.4 10/26/2021   PLT 360 10/26/2021   MCHC 34.0 10/26/2021   RDW 13.3 10/26/2021   LYMPHSABS 1.3 10/25/2021   MONOABS 1.6 (H) 10/25/2021   EOSABS 0.3 10/25/2021   BASOSABS 0.1 44/11/4740     Last metabolic panel Lab Results  Component Value Date   NA 137 10/26/2021   K 4.5 10/26/2021   CL 102 10/26/2021   CO2 24 10/26/2021   BUN 22 10/26/2021   CREATININE 0.99 10/26/2021   GLUCOSE 307 (H) 10/26/2021   GFRNONAA >60 10/26/2021   GFRAA >60 06/05/2020   CALCIUM 9.2 10/26/2021   PROT 7.1 10/26/2021   ALBUMIN 3.9 10/26/2021   BILITOT 0.9 10/26/2021   ALKPHOS 54 10/26/2021   AST 21 10/26/2021   ALT 37 10/26/2021   ANIONGAP 11 10/26/2021     Radiology Studies: DG Abd Portable 1V-Small Bowel Obstruction Protocol-initial, 8 hr delay  Result Date: 10/26/2021 CLINICAL DATA:  8 hour delay EXAM: PORTABLE ABDOMEN - 1 VIEW IMPRESSION: Contrast opacifies dilated of small bowel in the central abdomen, including in the right lower quadrant. Electronically Signed   By: Julian Hy M.D.   On: 10/26/2021 01:13   DG Abd Portable 1V-Small Bowel Protocol-Position Verification  Result Date: 10/25/2021 CLINICAL DATA:  NG tube placement. IMPRESSION: Enteric tube terminating in the stomach. Electronically Signed   By: Logan Bores M.D.   On: 10/25/2021 14:27   CT Renal Stone Study  Result Date: 10/25/2021 CLINICAL DATA:  66 year old male with history of bilateral flank pain. IMPRESSION: 1. Status post radical cystoprostatectomy with right lower quadrant ileal  conduit, with adjacent peristomal hernia. On today's examination, there appears to be small-bowel obstruction likely entrapped small bowel loops in this peristomal hernia. Surgical consultation is recommended. 2. Aortic atherosclerosis, in addition to three-vessel coronary artery disease. Please note that although the presence of coronary artery calcium documents the presence of coronary artery disease, the severity of this disease and any potential stenosis cannot be assessed on this non-gated CT examination. Assessment for potential risk factor modification, dietary therapy or pharmacologic therapy may be warranted, if clinically indicated. 3. 3.4 x 3.2 cm infrarenal abdominal aortic aneurysm. Recommend follow-up ultrasound every 3 years. This recommendation follows ACR consensus guidelines: White Paper of the ACR Incidental Findings Committee II on Vascular  Findings. J Am Coll Radiol 2013; 10:789-794. 4. Additional incidental findings, as above. Electronically Signed   By: Vinnie Langton M.D.   On: 10/25/2021 09:16       Jurell Basista M.D. [facility] 10/26/2021, 11:29 AM  Available via Epic secure chat 7am-7pm After 7 pm, please refer to night coverage provider listed on amion.

## 2021-10-26 NOTE — Plan of Care (Signed)
  Problem: Activity: Goal: Risk for activity intolerance will decrease Outcome: Progressing   Problem: Elimination: Goal: Will not experience complications related to bowel motility Outcome: Progressing   

## 2021-10-26 NOTE — Progress Notes (Signed)
Subjective/Chief Complaint: No abd pain, no flatus   Objective: Vital signs in last 24 hours: Temp:  [97.8 F (36.6 C)-98.6 F (37 C)] 98.3 F (36.8 C) (02/12 0831) Pulse Rate:  [92-113] 101 (02/12 0831) Resp:  [15-37] 20 (02/12 0831) BP: (137-160)/(81-115) 145/89 (02/12 0831) SpO2:  [88 %-95 %] 88 % (02/12 0831)    Intake/Output from previous day: 02/11 0701 - 02/12 0700 In: 1441.1 [I.V.:1391.1; IV Piggyback:50] Out: 3400 [Urine:2550; Emesis/NG output:850] Intake/Output this shift: No intake/output data recorded.  GI: not a lot in ng cannister- nonbilious, soft nontender ileal conduit functional Lab Results:  Recent Labs    10/25/21 0810 10/26/21 0350  WBC 18.9* 12.0*  HGB 16.5 15.1  HCT 47.2 44.4  PLT 392 360   BMET Recent Labs    10/25/21 0810 10/26/21 0350  NA 134* 137  K 4.0 4.5  CL 100 102  CO2 22 24  GLUCOSE 296* 307*  BUN 20 22  CREATININE 0.84 0.99  CALCIUM 9.6 9.2   PT/INR No results for input(s): LABPROT, INR in the last 72 hours. ABG No results for input(s): PHART, HCO3 in the last 72 hours.  Invalid input(s): PCO2, PO2  Studies/Results: DG Abd Portable 1V-Small Bowel Obstruction Protocol-initial, 8 hr delay  Result Date: 10/26/2021 CLINICAL DATA:  8 hour delay EXAM: PORTABLE ABDOMEN - 1 VIEW COMPARISON:  CT abdomen/pelvis dated 10/25/2021 FINDINGS: Contrast opacifies dilated loops of small bowel in the central abdomen, including in the right lower quadrant. Surgical sutures in the right lower quadrant.  Stool in the cecum. Enteric tube terminates in the gastric cardia. Cholecystectomy clips. Surgical clips in the pelvis related to prior prostatectomy. Degenerative changes of the lumbar spine. IMPRESSION: Contrast opacifies dilated of small bowel in the central abdomen, including in the right lower quadrant. Electronically Signed   By: Julian Hy M.D.   On: 10/26/2021 01:13   DG Abd Portable 1V-Small Bowel Protocol-Position  Verification  Result Date: 10/25/2021 CLINICAL DATA:  NG tube placement. EXAM: PORTABLE ABDOMEN - 1 VIEW COMPARISON:  CT abdomen and pelvis 10/25/2021 FINDINGS: An enteric tube has been placed with tip and side hole projecting over the proximal stomach. Cholecystectomy clips are noted. Small bowel dilatation described on the earlier CT is partially visualized in the left upper quadrant, incompletely included on this radiograph which excludes the lower abdomen and pelvis. Mild atelectasis or scarring is noted in the lung bases. IMPRESSION: Enteric tube terminating in the stomach. Electronically Signed   By: Logan Bores M.D.   On: 10/25/2021 14:27   CT Renal Stone Study  Result Date: 10/25/2021 CLINICAL DATA:  66 year old male with history of bilateral flank pain. EXAM: CT ABDOMEN AND PELVIS WITHOUT CONTRAST TECHNIQUE: Multidetector CT imaging of the abdomen and pelvis was performed following the standard protocol without IV contrast. RADIATION DOSE REDUCTION: This exam was performed according to the departmental dose-optimization program which includes automated exposure control, adjustment of the mA and/or kV according to patient size and/or use of iterative reconstruction technique. COMPARISON:  CT the abdomen and pelvis 06/03/2020. FINDINGS: Lower chest: Mild scarring in the lung bases. Atherosclerotic calcifications in the left anterior descending, left circumflex and right coronary arteries. Hepatobiliary: No suspicious cystic or solid hepatic lesions are confidently identified on today's noncontrast CT examination. Status post cholecystectomy. Pancreas: No definite pancreatic mass or peripancreatic fluid collections or inflammatory changes are noted on today's noncontrast CT examination. Spleen: Unremarkable. Adrenals/Urinary Tract: No calcifications are identified within the collecting system of either kidney  or along the course of either ureter. In the posterolateral aspect of the right kidney (axial  image 40 of series 2) there is a 1.2 cm intermediate attenuation lesion, stable compared to the prior examination, incompletely characterized on today's non-contrast CT examination, but statistically likely to represent a proteinaceous/hemorrhagic cyst. Unenhanced appearance of the left kidney and bilateral adrenal glands is normal. 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 Stomach/Bowel: Normal appearance of the stomach. There are multiple dilated loops of small bowel with air-fluid levels measuring up to 3.8 cm in diameter. Distal small bowel is decompressed beyond a parastomal hernia which contains loops of mid to distal ileum. Colon also appears relatively decompressed. Scattered colonic diverticulae are noted, without surrounding inflammatory changes to suggest an acute diverticulitis at this time. Normal appendix. Vascular/Lymphatic: Atherosclerotic calcifications are noted in the abdominal aorta and pelvic vasculature. Fusiform aneurysmal dilatation of the infrarenal abdominal aorta measuring up to 3.4 x 3.2 cm (axial image 51 of series 2). Reproductive: Prostate gland is surgically absent. Other: No significant volume of ascites.  No pneumoperitoneum. Musculoskeletal: There are no aggressive appearing lytic or blastic lesions noted in the visualized portions of the skeleton. IMPRESSION: 1. 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 On today's examination, there appears to be small-bowel obstruction likely entrapped small bowel loops in this peristomal hernia. Surgical consultation is recommended. 2. Aortic atherosclerosis, in addition to three-vessel coronary artery disease. Please note that although the presence of coronary artery calcium documents the presence of coronary artery disease, the severity of this disease and any potential stenosis cannot be assessed on this non-gated CT examination.  Assessment for potential risk factor modification, dietary therapy or pharmacologic therapy may be warranted, if clinically indicated. 3. 3.4 x 3.2 cm infrarenal abdominal aortic aneurysm. Recommend follow-up ultrasound every 3 years. This recommendation follows ACR consensus guidelines: White Paper of the ACR Incidental Findings Committee II on Vascular Findings. J Am Coll Radiol 2013; 10:789-794. 4. Additional incidental findings, as above. Electronically Signed   By: Vinnie Langton M.D.   On: 10/25/2021 09:16    Anti-infectives: Anti-infectives (From admission, onward)    Start     Dose/Rate Route Frequency Ordered Stop   10/25/21 1500  ceFAZolin (ANCEF) IVPB 1 g/50 mL premix        1 g 100 mL/hr over 30 Minutes Intravenous Every 8 hours 10/25/21 1449 11/01/21 1359       Assessment/Plan: SBO, history cystectomy with ileal conduit, longstanding parastomal herni   -agree with Dr Barry Dienes this isnt likely due to hernia but adhesions in pelvis -no indication for surgery now-although not better and contrast has not passed into colon -repeat xray in am -pharm dvt prophylaxis is fine to proceed with  DM with hyperglycemia AAA  I reviewed hospitalist notes, last 24 h vitals and pain scores, last 48 h intake and output, last 24 h labs and trends, and last 24 h imaging results.  This care required moderate level of medical decision making.   Rolm Bookbinder 10/26/2021

## 2021-10-26 NOTE — Assessment & Plan Note (Addendum)
-   Seen incidentally on CT, 3.4 x 2.2 cm AAA, recommended outpatient follow-up every 3 years. -Also noted atherosclerosis, three-vessel CAD on CT.  Outpatient follow-up with PCP and cardiology recommended.

## 2021-10-26 NOTE — Assessment & Plan Note (Addendum)
-   Presented with constipation, abdominal pain, nausea but no vomiting. 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 -Patient recommended good bowel regimen at home to avoid constipation

## 2021-10-26 NOTE — Assessment & Plan Note (Addendum)
-   Resolved, patient was being treated outpatient with oral Keflex and was placed on IV cefazolin while inpatient. -No further need for antibiotics

## 2021-10-26 NOTE — Assessment & Plan Note (Addendum)
-   Resume omega 3 capsules upon dc

## 2021-10-26 NOTE — Assessment & Plan Note (Signed)
-   Currently no wheezing, stable

## 2021-10-26 NOTE — Assessment & Plan Note (Addendum)
-   Likely due to cellulitis around stoma, was being treated outpatient with Keflex, was placed on cefazolin inpatient -Resolved, no further need of antibiotics

## 2021-10-26 NOTE — Assessment & Plan Note (Addendum)
Continue Norvasc

## 2021-10-26 NOTE — Progress Notes (Addendum)
NG tube has been removed residual < 33ml after been clamped for 4 hrs.  No n/v observed or reported by patient.  Pt tolerated well.

## 2021-10-26 NOTE — Assessment & Plan Note (Addendum)
-  Hemoglobin A1c 7.7 -Continue outpatient regimen

## 2021-10-26 NOTE — Hospital Course (Signed)
Patient is a 66 year old male admitted history of C, history of radical cystoprostatectomy with right lower quadrant ileal conduit, type 2 diabetes mellitus, pulmonary emphysema/fibrosis, hypertriglyceridemia, presented to ED with abdominal pain, nausea but no emesis. In ED CBC showed white count of 18.9 with 80% neutrophils, hemoglobin 16.5, glucose 296 on CMP.  CT renal stone study showed entrapped small bowel loops in the peristomal hernia  Patient was admitted for further work-up

## 2021-10-27 ENCOUNTER — Inpatient Hospital Stay (HOSPITAL_COMMUNITY): Payer: No Typology Code available for payment source

## 2021-10-27 DIAGNOSIS — L03818 Cellulitis of other sites: Secondary | ICD-10-CM

## 2021-10-27 LAB — BASIC METABOLIC PANEL
Anion gap: 6 (ref 5–15)
BUN: 22 mg/dL (ref 8–23)
CO2: 25 mmol/L (ref 22–32)
Calcium: 8.3 mg/dL — ABNORMAL LOW (ref 8.9–10.3)
Chloride: 109 mmol/L (ref 98–111)
Creatinine, Ser: 0.62 mg/dL (ref 0.61–1.24)
GFR, Estimated: >60 mL/min (ref >60)
Glucose, Bld: 151 mg/dL — ABNORMAL HIGH (ref 70–99)
Potassium: 3.7 mmol/L (ref 3.5–5.1)
Sodium: 140 mmol/L (ref 135–145)

## 2021-10-27 LAB — GLUCOSE, CAPILLARY
Glucose-Capillary: 155 mg/dL — ABNORMAL HIGH (ref 70–99)
Glucose-Capillary: 162 mg/dL — ABNORMAL HIGH (ref 70–99)
Glucose-Capillary: 218 mg/dL — ABNORMAL HIGH (ref 70–99)
Glucose-Capillary: 198 mg/dL — ABNORMAL HIGH (ref 70–99)
Glucose-Capillary: 147 mg/dL — ABNORMAL HIGH (ref 70–99)

## 2021-10-27 LAB — MAGNESIUM: Magnesium: 1.9 mg/dL (ref 1.7–2.4)

## 2021-10-27 MED ORDER — INSULIN ASPART 100 UNIT/ML IJ SOLN: 0.0000 [IU] | Freq: Every day | INTRAMUSCULAR | Status: DC

## 2021-10-27 MED ORDER — AMLODIPINE BESYLATE 5 MG PO TABS
2.5000 mg | ORAL_TABLET | Freq: Every day | ORAL | Status: DC
  Administered 2021-10-27 – 2021-10-28 (×2): 2.5 mg via ORAL
  Filled 2021-10-27 (×2): qty 1

## 2021-10-27 MED ORDER — POLYETHYLENE GLYCOL 3350 17 G PO PACK: 17.0000 g | PACK | Freq: Every day | ORAL | Status: DC | PRN

## 2021-10-27 MED ORDER — CYCLOBENZAPRINE HCL 5 MG PO TABS: 5.0000 mg | ORAL_TABLET | Freq: Every day | ORAL | Status: DC | PRN

## 2021-10-27 MED ORDER — SENNOSIDES-DOCUSATE SODIUM 8.6-50 MG PO TABS
1.0000 | ORAL_TABLET | Freq: Two times a day (BID) | ORAL | Status: DC
  Administered 2021-10-27 – 2021-10-28 (×2): 1 via ORAL
  Filled 2021-10-27 (×2): qty 1

## 2021-10-27 MED ORDER — INSULIN ASPART 100 UNIT/ML IJ SOLN
0.0000 [IU] | Freq: Three times a day (TID) | INTRAMUSCULAR | Status: DC
  Administered 2021-10-27 – 2021-10-28 (×2): 3 [IU] via SUBCUTANEOUS

## 2021-10-27 MED ORDER — PANTOPRAZOLE SODIUM 40 MG PO TBEC
40.0000 mg | DELAYED_RELEASE_TABLET | Freq: Every day | ORAL | Status: DC
  Administered 2021-10-27 – 2021-10-28 (×2): 40 mg via ORAL
  Filled 2021-10-27 (×2): qty 1

## 2021-10-27 NOTE — Progress Notes (Signed)
The patient is receiving Protonix by the intravenous route.  Based on criteria approved by the Pharmacy and [place], the medication is being converted to the equivalent oral dose form.  These criteria include: -No active GI bleeding -Able to tolerate diet of full liquids (or better) or tube feeding -Able to tolerate other medications by the oral or enteral route  If you have any questions about this conversion, please contact the Pharmacy Department (phone [phone]).  Thank you.   Minda Ditto PharmD [facility] [phone] 10/27/2021, 10:42 AM

## 2021-10-27 NOTE — Progress Notes (Signed)
Subjective/Chief Complaint: Passing flatus and multiple bowel movements. Feeling much better from yesterday. Ambulating  Objective: Vital signs in last 24 hours: Temp:  [97.6 F (36.4 C)-98.2 F (36.8 C)] 97.9 F (36.6 C) (02/13 0408) Pulse Rate:  [85-110] 85 (02/13 0408) Resp:  [17-20] 18 (02/13 0408) BP: (146-156)/(87) 151/87 (02/13 0408) SpO2:  [90 %-92 %] 90 % (02/13 0408) Last BM Date: 10/27/21  Intake/Output from previous day: 02/12 0701 - 02/13 0700 In: 2268.2 [P.O.:480; I.V.:1638.2; IV Piggyback:150] Out: 1450 [Urine:1450] Intake/Output this shift: No intake/output data recorded.  PE: General: pleasant, WD, male who is laying in bed in NAD HEENT: head is normocephalic, atraumatic.  Mouth is pink and moist Lungs:Respiratory effort nonlabored Abd: soft, NT, ND, +BS GU: ileal conduit with clear yellow urine MSK: all 4 extremities are symmetrical with no cyanosis, clubbing, or edema. Skin: warm and dry  Psych: A&Ox3 with an appropriate affect.   Lab Results:  Recent Labs    10/25/21 0810 10/26/21 0350  WBC 18.9* 12.0*  HGB 16.5 15.1  HCT 47.2 44.4  PLT 392 360    BMET Recent Labs    10/26/21 0350 10/27/21 0348  NA 137 140  K 4.5 3.7  CL 102 109  CO2 24 25  GLUCOSE 307* 151*  BUN 22 22  CREATININE 0.99 0.62  CALCIUM 9.2 8.3*    PT/INR No results for input(s): LABPROT, INR in the last 72 hours. ABG No results for input(s): PHART, HCO3 in the last 72 hours.  Invalid input(s): PCO2, PO2  Studies/Results: DG Abd Portable 1V  Result Date: 10/27/2021 CLINICAL DATA:  Small bowel obstruction. EXAM: PORTABLE ABDOMEN - 1 VIEW COMPARISON:  Abdomen film yesterday at 12:56 a.m. FINDINGS: Contrast has been eliminated from the small bowel. There is scattered contrast and stool throughout a normal caliber large intestine to the rectum. There is still dilatation of central abdominal small bowel up to 4.1 cm, previously the maximum diameter was 4.7 cm.  Multiple surgical clips extend along both pelvic sidewalls. The visceral shadows are stable with cholecystectomy clips in right upper abdomen. Interval removal NGT.  No supine findings of free air. Degenerative change lumbar spine.  The lung bases generally clear. IMPRESSION: There is still significant dilatation of central abdominal small bowel to 4.1 cm but with improvement. Contrast has been eliminated from the small bowel since yesterday's exam. NGT has also been removed. Electronically Signed   By: Telford Nab M.D.   On: 10/27/2021 07:30   DG Abd Portable 1V-Small Bowel Obstruction Protocol-initial, 8 hr delay  Result Date: 10/26/2021 CLINICAL DATA:  8 hour delay EXAM: PORTABLE ABDOMEN - 1 VIEW COMPARISON:  CT abdomen/pelvis dated 10/25/2021 FINDINGS: Contrast opacifies dilated loops of small bowel in the central abdomen, including in the right lower quadrant. Surgical sutures in the right lower quadrant.  Stool in the cecum. Enteric tube terminates in the gastric cardia. Cholecystectomy clips. Surgical clips in the pelvis related to prior prostatectomy. Degenerative changes of the lumbar spine. IMPRESSION: Contrast opacifies dilated of small bowel in the central abdomen, including in the right lower quadrant. Electronically Signed   By: Julian Hy M.D.   On: 10/26/2021 01:13   DG Abd Portable 1V-Small Bowel Protocol-Position Verification  Result Date: 10/25/2021 CLINICAL DATA:  NG tube placement. EXAM: PORTABLE ABDOMEN - 1 VIEW COMPARISON:  CT abdomen and pelvis 10/25/2021 FINDINGS: An enteric tube has been placed with tip and side hole projecting over the proximal stomach. Cholecystectomy clips are noted. Small bowel  dilatation described on the earlier CT is partially visualized in the left upper quadrant, incompletely included on this radiograph which excludes the lower abdomen and pelvis. Mild atelectasis or scarring is noted in the lung bases. IMPRESSION: Enteric tube terminating in the  stomach. Electronically Signed   By: Logan Bores M.D.   On: 10/25/2021 14:27    Anti-infectives: Anti-infectives (From admission, onward)    Start     Dose/Rate Route Frequency Ordered Stop   10/25/21 1500  ceFAZolin (ANCEF) IVPB 1 g/50 mL premix        1 g 100 mL/hr over 30 Minutes Intravenous Every 8 hours 10/25/21 1449 11/01/21 1359       Assessment/Plan: SBO, history cystectomy with ileal conduit, longstanding parastomal hernia  - favored to be unlikely due to hernia but adhesions in pelvis - now having bowel function. repeat xray this am shows continued SB dilation but improved and no contrast - tolerating NGT out and clears - can advance to soft diet though we discussed to go slow and can continue with full liquids predominantly  - given persistent small bowel dilatation would favor monitoring soft diet today with hopeful discharge tomorrow am  FEN: soft ID: ancef VTE: pharm dvt prophylaxis is fine to proceed with  DM with hyperglycemia AAA  I reviewed hospitalist notes, last 24 h vitals and pain scores, last 48 h intake and output, last 24 h labs and trends, and last 24 h imaging results.  This care required moderate level of medical decision making.   Winferd Humphrey, [facility] 10/27/2021, 11:10 AM Please see Amion for pager number during day hours 7:00am-4:30pm

## 2021-10-27 NOTE — Discharge Instructions (Signed)

## 2021-10-27 NOTE — Progress Notes (Signed)
Nutrition Brief Note  Patient identified on the Malnutrition Screening Tool (MST) Report.  Wt Readings from Last 15 Encounters:  10/25/21 83.9 kg  04/29/21 83.9 kg  12/13/20 82.6 kg  11/05/20 82.6 kg  06/05/20 78.4 kg  11/17/19 79.8 kg  08/13/19 80.7 kg  01/31/18 80.7 kg  01/13/18 80.7 kg    Body mass index is 25.09 kg/m. Patient meets criteria for normal weight based on current BMI.  Skin WDL.  Patient admitted for SBO. NGT placed for suctioning at the beginning of hospitalization and was removed yesterday evening.  Diet advanced to CLD on 2/12 after dinner and to Soft today at 0940. No intakes documented this hospitalization. Visualized breakfast tray with 100% completion (liquids only as was delivered prior to diet advancement).   Patient shares that he has intentionally gained weight more recently. He has great support from outpatient providers and reports CBGs are well controlled (usually </= 120 mg/dl).  Let patient know that RD will place handout in Discharge Instructions outlining current diet order. Briefly reviewed that this is mainly low fiber foods (provided examples) and limiting spices.   Labs and medications reviewed.   No nutrition interventions warranted at this time. If nutrition issues arise, please consult RD.      Shaun Matin, MS, RD, LDN Inpatient Clinical Dietitian RD pager # available in [place]  After hours/weekend pager # available in [facility]

## 2021-10-27 NOTE — Progress Notes (Signed)
[facility]                                                                               Shaun Pittman, is a 66 y.o. 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 In ED CBC showed white count of 18.9 with 80% neutrophils, hemoglobin 16.5, glucose 296 on CMP.  CT renal stone study showed entrapped small bowel loops in the peristomal hernia  Patient was admitted for further work-up   Assessment & Plan    Assessment and Plan: * SBO (small bowel obstruction) ([place])- (present on admission) - Presented with constipation, abdominal pain, nausea but no vomiting. -CT renal stone study showed entrapped small bowel loops in the parastomal hernia, coronary calcification, aortic atherosclerosis, AAA -Patient was placed on n.p.o., IV fluids and NGT on 2/12, had 2 good BM and NG was clamped/removed in the evening -Surgery following.  Patient has tolerated clear liquid diet, advance to full liquids or soft solids today. -Continue bowel regimen  Leukocytosis- (present on admission) - Likely due to cellulitis around stoma, was being treated outpatient with Keflex -Improving, continue cefazolin  Cellulitis- (present on admission) - Continue IV cefazolin, was being treated outpatient with oral Keflex  Type 2 diabetes mellitus with hyperglycemia ([place])- (present on admission) -Hemoglobin A1c 7.7 -CBGs before every meal and at bedtime, moderate SSI  Hypertension- (present on admission) - DC IV Lopressor, resume oral Norvasc  Pulmonary emphysema ([place])- (present on admission) - Currently no wheezing,  stable  Pure hypertriglyceridemia- (present on admission) - Resume omega 3 capsules upon dc  AAA (abdominal aortic aneurysm)- (present on admission) - Seen incidentally on CT, 3.4 x 2.2 cm AAA, recommended outpatient follow-up every 3 years. -Also noted atherosclerosis, three-vessel CAD on CT.  Outpatient follow-up with PCP and cardiology recommended.   Code Status: Full code DVT Prophylaxis:  SCDs Start: 10/25/21 1139   Level of Care: Level of care: Telemetry Family Communication:  Disposition Plan:     Remains inpatient appropriate: Advance diet slowly today, hopefully DC home in a.m.  Procedures:    Consultants:   Surgery  Antimicrobials:   Anti-infectives (From admission, onward)    Start     Dose/Rate Route Frequency Ordered Stop   10/25/21 1500  ceFAZolin (ANCEF) IVPB 1 g/50 mL premix        1 g 100 mL/hr over 30 Minutes Intravenous Every 8 hours 10/25/21 1449 11/01/21 1359        Medications   amLODipine  2.5 mg Oral Daily   insulin aspart  0-15 Units Subcutaneous TID WC   insulin aspart  0-5 Units Subcutaneous QHS   pantoprazole  40 mg Oral Daily     Subjective:   Shaun Pittman was seen and examined today.  Had 2 good BMs yesterday, NGT was clamped and removed  in the evening.  Tolerated.  Clear liquid diet this morning.  No nausea or vomiting.  No fevers, abdominal pain.  Objective:   Vitals:   10/26/21 1300 10/26/21 2004 10/27/21 0408 10/27/21 1138  BP: (!) 156/87 (!) 146/87 (!) 151/87 139/78  Pulse: 98 (!) 110 85 88  Resp: 20 17 18 18   Temp: 98.2 F (36.8 C) 97.6 F (36.4 C) 97.9 F (36.6 C) 98 F (36.7 C)  TempSrc: Oral Oral Oral Oral  SpO2: 92% 92% 90% 92%  Weight:      Height:        Intake/Output Summary (Last 24 hours) at 10/27/2021 1450 Last data filed at 10/27/2021 0433 Gross per 24 hour  Intake 1640.42 ml  Output 1450 ml  Net 190.42 ml   Filed Weights   10/25/21 0759  Weight: 83.9 kg     Exam General: Alert and oriented x  3, NAD Cardiovascular: S1 S2 auscultated, no murmurs, RRR Respiratory: Clear to auscultation bilaterally, no wheezing, rales or rhonchi Gastrointestinal: Soft, nontender, nondistended, + bowel sounds, ileal conduit Ext: no pedal edema bilaterally, mild swelling in the right upper arm due to infiltrated IV Neuro: no new deficits   Data Reviewed:  I have personally reviewed following labs    CBC Lab Results  Component Value Date   WBC 12.0 (H) 10/26/2021   RBC 5.13 10/26/2021   HGB 15.1 10/26/2021   HCT 44.4 10/26/2021   MCV 86.5 10/26/2021   MCH 29.4 10/26/2021   PLT 360 10/26/2021   MCHC 34.0 10/26/2021   RDW 13.3 10/26/2021   LYMPHSABS 1.3 10/25/2021   MONOABS 1.6 (H) 10/25/2021   EOSABS 0.3 10/25/2021   BASOSABS 0.1 48/88/9169     Last metabolic panel Lab Results  Component Value Date   NA 140 10/27/2021   K 3.7 10/27/2021   CL 109 10/27/2021   CO2 25 10/27/2021   BUN 22 10/27/2021   CREATININE 0.62 10/27/2021   GLUCOSE 151 (H) 10/27/2021   GFRNONAA >60 10/27/2021   GFRAA >60 06/05/2020   CALCIUM 8.3 (L) 10/27/2021   PROT 7.1 10/26/2021   ALBUMIN 3.9 10/26/2021   BILITOT 0.9 10/26/2021   ALKPHOS 54 10/26/2021   AST 21 10/26/2021   ALT 37 10/26/2021   ANIONGAP 6 10/27/2021    CBG (last 3)  Recent Labs    10/27/21 0405 10/27/21 0720 10/27/21 1136  GLUCAP 155* 162* 218*      Radiology Studies: I have personally reviewed the imaging studies  DG Abd Portable 1V  Result Date: 10/27/2021 CLINICAL DATA:  Small bowel obstruction.  IMPRESSION: There is still significant dilatation of central abdominal small bowel to 4.1 cm but with improvement. Contrast has been eliminated from the small bowel since yesterday's exam. NGT has also been removed. Electronically Signed   By: Telford Nab M.D.   On: 10/27/2021 07:30      Kerissa Coia M.D. [facility] 10/27/2021, 2:50 PM  Available via Epic secure chat 7am-7pm After 7 pm, please refer to night  coverage provider listed on amion.

## 2021-10-28 LAB — GLUCOSE, CAPILLARY
Glucose-Capillary: 155 mg/dL — ABNORMAL HIGH (ref 70–99)
Glucose-Capillary: 161 mg/dL — ABNORMAL HIGH (ref 70–99)
Glucose-Capillary: 161 mg/dL — ABNORMAL HIGH (ref 70–99)
Glucose-Capillary: 258 mg/dL — ABNORMAL HIGH (ref 70–99)

## 2021-10-28 LAB — HIV ANTIBODY (ROUTINE TESTING W REFLEX): HIV Screen 4th Generation wRfx: NONREACTIVE

## 2021-10-28 MED ORDER — ONDANSETRON HCL 4 MG PO TABS: 4.0000 mg | ORAL_TABLET | Freq: Three times a day (TID) | ORAL | 0 refills | Status: AC | PRN

## 2021-10-28 NOTE — Discharge Summary (Signed)
Physician Discharge Summary   Patient: Shaun Pittman MRN: [id number] DOB: April 07, 1956  Admit date:     10/25/2021  Discharge date: 10/28/21  Discharge Physician: Estill Cotta   PCP: [facility]   Recommendations at discharge:    Seen incidentally on CT, 3.4 x 2.2 cm AAA, recommended outpatient follow-up every 3 years. Also noted atherosclerosis, three-vessel CAD on CT.  Outpatient follow-up with cardiology recommended.  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 In ED CBC showed white count of 18.9 with 80% neutrophils, hemoglobin 16.5, glucose 296 on CMP.  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 -Patient recommended good bowel regimen at home to avoid constipation  Leukocytosis- (present on admission) - Likely due to cellulitis around stoma, was being treated outpatient with Keflex, was placed on cefazolin  inpatient -Resolved, no further need of antibiotics  Cellulitis- (present on admission) - Resolved, patient was being treated outpatient with oral Keflex and was placed on IV cefazolin while inpatient. -No further need for antibiotics  Type 2 diabetes mellitus with hyperglycemia ([place])- (present on admission) -Hemoglobin A1c 7.7 -Continue outpatient regimen  Hypertension- (present on admission) - Continue Norvasc  Pulmonary emphysema ([place])- (present on admission) - Currently no wheezing, stable  Pure hypertriglyceridemia- (present on admission) - Resume omega 3 capsules upon dc  AAA (abdominal aortic aneurysm)- (present on admission) - Seen incidentally on CT, 3.4 x 2.2 cm AAA, recommended outpatient follow-up every 3 years. -Also noted atherosclerosis, three-vessel CAD on CT.  Outpatient follow-up with PCP and cardiology recommended.    Pain control - [employer] Controlled Substance Reporting System database was reviewed. and patient was instructed, not to drive, operate heavy machinery, perform activities at heights, swimming or participation in water activities or provide baby-sitting services while on Pain, Sleep and Anxiety Medications; until their outpatient Physician has advised to do so again. Also recommended to not to take more than prescribed Pain, Sleep and Anxiety Medications.   Consultants: General surgery Procedures performed:  Disposition: Home Diet recommendation:  Discharge Diet Orders (From admission, onward)     Start     Ordered   10/28/21 0000  Diet - low sodium heart healthy        10/28/21 0900           Cardiac diet  DISCHARGE MEDICATION: Allergies as of 10/28/2021       Reactions   Glipizide Swelling   Other reaction(s): Lip swelling   Statins Other (See Comments)   Muscle pain   Sulfamethoxazole-trimethoprim Other (See Comments)   unknown  Zithromax [azithromycin]    rash   Alogliptin    Other reaction(s): Gastroesophageal reflux  disease, Other (See Comments)   Bee Venom Rash   Gemfibrozil Rash   Losartan Rash   Sulfa Antibiotics Rash   rash        Medication List     STOP taking these medications    cephALEXin 500 MG capsule Commonly known as: KEFLEX   doxycycline 100 MG tablet Commonly known as: VIBRA-TABS   LORazepam 0.5 MG tablet Commonly known as: Ativan       TAKE these medications    amLODipine 2.5 MG tablet Commonly known as: NORVASC Take 2.5 mg by mouth daily.   aspirin EC 81 MG tablet Take 81 mg by mouth daily. Swallow whole.   cholecalciferol 25 MCG (1000 UNIT) tablet Commonly known as: VITAMIN D3 Take 1,000 Units by mouth daily.   cyclobenzaprine 10 MG tablet Commonly known as: FLEXERIL Take 5 mg by mouth daily as needed for muscle spasms.   Fish Oil 1200 MG Caps Take 1,200 mg by mouth daily.   ibuprofen 200 MG tablet Commonly known as: ADVIL Take 200 mg by mouth every 6 (six) hours as needed for moderate pain.   insulin glargine 100 UNIT/ML injection Commonly known as: LANTUS Inject 40 Units into the skin daily.   metFORMIN 500 MG 24 hr tablet Commonly known as: GLUCOPHAGE-XR Take 1,000 mg by mouth 2 (two) times daily after a meal.   ondansetron 4 MG tablet Commonly known as: Zofran Take 1 tablet (4 mg total) by mouth every 8 (eight) hours as needed for nausea or vomiting.   Pepcid Complete 10-800-165 MG chewable tablet Generic drug: famotidine-calcium carbonate-magnesium hydroxide Chew 1 tablet by mouth daily.   vitamin B-12 100 MCG tablet Commonly known as: CYANOCOBALAMIN Take 100 mcg by mouth daily.        Follow-up Information     Clinic, [place]. Schedule an appointment as soon as possible for a visit in 2 week(s).   Why: for hospital follow-up Contact information: [address] [address] [phone]                 Discharge Exam: Danley Danker Weights   10/25/21 0759  Weight: 83.9 kg   S: Had  another BM this morning, tolerating soft diet, eager to go home.  No abdominal pain, nausea or vomiting.  Vitals:   10/27/21 0408 10/27/21 1138 10/27/21 2000 10/28/21 0416  BP: (!) 151/87 139/78 129/82 134/84  Pulse: 85 88 78 92  Resp: 18 18 16 16   Temp: 97.9 F (36.6 C) 98 F (36.7 C) 98.2 F (36.8 C) 98.4 F (36.9 C)  TempSrc: Oral Oral Oral Oral  SpO2: 90% 92% 95% 93%  Weight:      Height:        Physical Exam General: Alert and oriented x 3, NAD Cardiovascular: S1 S2 clear, RRR. No pedal edema b/l Respiratory: CTAB, no wheezing, rales or rhonchi Gastrointestinal: Soft, nontender, nondistended, NBS, ileal conduit with clear yellow urine Ext: no pedal edema bilaterally Neuro: no new deficits   Condition at discharge: good  The results of significant diagnostics from this hospitalization (including imaging, microbiology, ancillary and laboratory) are listed below for reference.   Imaging Studies: DG Abd Portable 1V  Result Date: 10/27/2021 CLINICAL DATA:  Small bowel obstruction. EXAM: PORTABLE ABDOMEN - 1 VIEW COMPARISON:  Abdomen film yesterday at 12:56 a.m. FINDINGS: Contrast has been eliminated from the small bowel. There is  scattered contrast and stool throughout a normal caliber large intestine to the rectum. There is still dilatation of central abdominal small bowel up to 4.1 cm, previously the maximum diameter was 4.7 cm. Multiple surgical clips extend along both pelvic sidewalls. The visceral shadows are stable with cholecystectomy clips in right upper abdomen. Interval removal NGT.  No supine findings of free air. Degenerative change lumbar spine.  The lung bases generally clear. IMPRESSION: There is still significant dilatation of central abdominal small bowel to 4.1 cm but with improvement. Contrast has been eliminated from the small bowel since yesterday's exam. NGT has also been removed. Electronically Signed   By: Telford Nab M.D.   On: 10/27/2021 07:30   DG Abd  Portable 1V-Small Bowel Obstruction Protocol-initial, 8 hr delay  Result Date: 10/26/2021 CLINICAL DATA:  8 hour delay EXAM: PORTABLE ABDOMEN - 1 VIEW COMPARISON:  CT abdomen/pelvis dated 10/25/2021 FINDINGS: Contrast opacifies dilated loops of small bowel in the central abdomen, including in the right lower quadrant. Surgical sutures in the right lower quadrant.  Stool in the cecum. Enteric tube terminates in the gastric cardia. Cholecystectomy clips. Surgical clips in the pelvis related to prior prostatectomy. Degenerative changes of the lumbar spine. IMPRESSION: Contrast opacifies dilated of small bowel in the central abdomen, including in the right lower quadrant. Electronically Signed   By: Julian Hy M.D.   On: 10/26/2021 01:13   DG Abd Portable 1V-Small Bowel Protocol-Position Verification  Result Date: 10/25/2021 CLINICAL DATA:  NG tube placement. EXAM: PORTABLE ABDOMEN - 1 VIEW COMPARISON:  CT abdomen and pelvis 10/25/2021 FINDINGS: An enteric tube has been placed with tip and side hole projecting over the proximal stomach. Cholecystectomy clips are noted. Small bowel dilatation described on the earlier CT is partially visualized in the left upper quadrant, incompletely included on this radiograph which excludes the lower abdomen and pelvis. Mild atelectasis or scarring is noted in the lung bases. IMPRESSION: Enteric tube terminating in the stomach. Electronically Signed   By: Logan Bores M.D.   On: 10/25/2021 14:27   CT Renal Stone Study  Result Date: 10/25/2021 CLINICAL DATA:  66 year old male with history of bilateral flank pain. EXAM: CT ABDOMEN AND PELVIS WITHOUT CONTRAST TECHNIQUE: Multidetector CT imaging of the abdomen and pelvis was performed following the standard protocol without IV contrast. RADIATION DOSE REDUCTION: This exam was performed according to the departmental dose-optimization program which includes automated exposure control, adjustment of the mA and/or kV according  to patient size and/or use of iterative reconstruction technique. COMPARISON:  CT the abdomen and pelvis 06/03/2020. FINDINGS: Lower chest: Mild scarring in the lung bases. Atherosclerotic calcifications in the left anterior descending, left circumflex and right coronary arteries. Hepatobiliary: No suspicious cystic or solid hepatic lesions are confidently identified on today's noncontrast CT examination. Status post cholecystectomy. Pancreas: No definite pancreatic mass or peripancreatic fluid collections or inflammatory changes are noted on today's noncontrast CT examination. Spleen: Unremarkable. Adrenals/Urinary Tract: No calcifications are identified within the collecting system of either kidney or along the course of either ureter. In the posterolateral aspect of the right kidney (axial image 40 of series 2) there is a 1.2 cm intermediate attenuation lesion, stable compared to the prior examination, incompletely characterized on today's non-contrast CT examination, but statistically likely to represent a proteinaceous/hemorrhagic cyst. Unenhanced appearance of the left kidney and bilateral adrenal glands is normal. 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 Stomach/Bowel: Normal appearance of the stomach. There are multiple dilated loops of  small bowel with air-fluid levels measuring up to 3.8 cm in diameter. Distal small bowel is decompressed beyond a parastomal hernia which contains loops of mid to distal ileum. Colon also appears relatively decompressed. Scattered colonic diverticulae are noted, without surrounding inflammatory changes to suggest an acute diverticulitis at this time. Normal appendix. Vascular/Lymphatic: Atherosclerotic calcifications are noted in the abdominal aorta and pelvic vasculature. Fusiform aneurysmal dilatation of the infrarenal abdominal aorta measuring up to 3.4 x 3.2 cm (axial image 51 of series 2). 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 Other: No significant volume of ascites.  No pneumoperitoneum. Musculoskeletal: There are no aggressive appearing lytic or blastic lesions noted in the visualized portions of the skeleton. IMPRESSION: 1. 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 On today's examination, there appears to be small-bowel obstruction likely entrapped small bowel loops in this peristomal hernia. Surgical consultation is recommended. 2. Aortic atherosclerosis, in addition to three-vessel coronary artery disease. Please note that although the presence of coronary artery calcium documents the presence of coronary artery disease, the severity of this disease and any potential stenosis cannot be assessed on this non-gated CT examination. Assessment for potential risk factor modification, dietary therapy or pharmacologic therapy may be warranted, if clinically indicated. 3. 3.4 x 3.2 cm infrarenal abdominal aortic aneurysm. Recommend follow-up ultrasound every 3 years. This recommendation follows ACR consensus guidelines: White Paper of the ACR Incidental Findings Committee II on Vascular Findings. J Am Coll Radiol 2013; 10:789-794. 4. Additional incidental findings, as above. Electronically Signed   By: Vinnie Langton M.D.   On: 10/25/2021 09:16    Microbiology: Results for orders placed or performed during the hospital encounter of 10/25/21  Resp Panel by RT-PCR (Flu A&B, Covid) Nasopharyngeal Swab     Status: None   Collection Time: 10/25/21 10:03 AM   Specimen: Nasopharyngeal Swab; Nasopharyngeal(NP) swabs in vial transport medium  Result Value Ref Range Status   SARS Coronavirus 2 by RT PCR NEGATIVE NEGATIVE Final    Comment: (NOTE) SARS-CoV-2 target nucleic acids are NOT DETECTED.  The SARS-CoV-2 RNA is generally detectable in upper respiratory specimens during the acute phase of infection. The lowest concentration of SARS-CoV-2 viral copies  this assay can detect is 138 copies/mL. A negative result does not preclude SARS-Cov-2 infection and should not be used as the sole basis for treatment or other patient management decisions. A negative result may occur with  improper specimen collection/handling, submission of specimen other than nasopharyngeal swab, presence of viral mutation(s) within the areas targeted by this assay, and inadequate number of viral copies(<138 copies/mL). A negative result must be combined with clinical observations, patient history, and epidemiological information. The expected result is Negative.  Fact Sheet for Patients:  EntrepreneurPulse.com.au  Fact Sheet for Healthcare Providers:  IncredibleEmployment.be  This test is no t yet approved or cleared by the [place] FDA and  has been authorized for detection and/or diagnosis of SARS-CoV-2 by FDA under an Emergency Use Authorization (EUA). This EUA will remain  in effect (meaning this test can be used) for the duration of the COVID-19 declaration under Section 564(b)(1) of the Act, 21 U.S.C.section 360bbb-3(b)(1), unless the authorization is terminated  or revoked sooner.       Influenza A by PCR NEGATIVE NEGATIVE Final   Influenza B by PCR NEGATIVE NEGATIVE Final    Comment: (NOTE) The Xpert Xpress SARS-CoV-2/FLU/RSV plus assay is intended as an aid in the diagnosis of influenza from Nasopharyngeal swab  specimens and should not be used as a sole basis for treatment. Nasal washings and aspirates are unacceptable for Xpert Xpress SARS-CoV-2/FLU/RSV testing.  Fact Sheet for Patients: EntrepreneurPulse.com.au  Fact Sheet for Healthcare Providers: IncredibleEmployment.be  This test is not yet approved or cleared by the [place] FDA and has been authorized for detection and/or diagnosis of SARS-CoV-2 by FDA under an Emergency Use Authorization (EUA). This EUA  will remain in effect (meaning this test can be used) for the duration of the COVID-19 declaration under Section 564(b)(1) of the Act, 21 U.S.C. section 360bbb-3(b)(1), unless the authorization is terminated or revoked.  Performed at [facility], [place] [address], [place], [place] [address]     Labs: CBC: Recent Labs  Lab 10/25/21 0810 10/26/21 0350  WBC 18.9* 12.0*  NEUTROABS 15.4*  --   HGB 16.5 15.1  HCT 47.2 44.4  MCV 84.4 86.5  PLT 392 510   Basic Metabolic Panel: Recent Labs  Lab 10/25/21 0809 10/25/21 0810 10/25/21 2158 10/26/21 0350 10/26/21 1127 10/27/21 0348  NA  --  134*  --  137  --  140  K  --  4.0  --  4.5  --  3.7  CL  --  100  --  102  --  109  CO2  --  22  --  24  --  25  GLUCOSE  --  296*  --  307*  --  151*  BUN  --  20  --  22  --  22  CREATININE  --  0.84  --  0.99  --  0.62  CALCIUM  --  9.6  --  9.2  --  8.3*  MG 1.4*  --  1.6*  --  1.5* 1.9   Liver Function Tests: Recent Labs  Lab 10/25/21 0810 10/26/21 0350  AST 33 21  ALT 41 37  ALKPHOS 75 54  BILITOT 0.7 0.9  PROT 7.8 7.1  ALBUMIN 4.3 3.9   CBG: Recent Labs  Lab 10/27/21 1957 10/28/21 0015 10/28/21 0412 10/28/21 0716 10/28/21 1139  GLUCAP 147* 161* 155* 161* 258*    Discharge time spent: greater than 30 minutes.  Signed: Estill Cotta, MD [facility] 10/28/2021

## 2021-10-28 NOTE — Progress Notes (Addendum)
Subjective/Chief Complaint: Tolerating soft diet and had another BM this am. No significant abdominal pain, nausea, or emesis. Feeling eager to go home  Objective: Vital signs in last 24 hours: Temp:  [98 F (36.7 C)-98.4 F (36.9 C)] 98.4 F (36.9 C) (02/14 0416) Pulse Rate:  [78-92] 92 (02/14 0416) Resp:  [16-18] 16 (02/14 0416) BP: (129-139)/(78-84) 134/84 (02/14 0416) SpO2:  [92 %-95 %] 93 % (02/14 0416) Last BM Date : 10/27/21  Intake/Output from previous day: 02/13 0701 - 02/14 0700 In: 400 [P.O.:300; IV Piggyback:100] Out: 300 [Urine:300] Intake/Output this shift: No intake/output data recorded.  PE: General: pleasant, WD, male who is laying in bed in NAD HEENT: head is normocephalic, atraumatic.  Mouth is pink and moist Lungs:Respiratory effort nonlabored Abd: soft, NT, ND, +BS, parastomal hernia soft and nontender GU: ileal conduit with clear yellow urine MSK: all 4 extremities are symmetrical with no cyanosis, clubbing, or edema. Skin: warm and dry  Psych: A&Ox3 with an appropriate affect.   Lab Results:  Recent Labs    10/26/21 0350  WBC 12.0*  HGB 15.1  HCT 44.4  PLT 360    BMET Recent Labs    10/26/21 0350 10/27/21 0348  NA 137 140  K 4.5 3.7  CL 102 109  CO2 24 25  GLUCOSE 307* 151*  BUN 22 22  CREATININE 0.99 0.62  CALCIUM 9.2 8.3*    PT/INR No results for input(s): LABPROT, INR in the last 72 hours. ABG No results for input(s): PHART, HCO3 in the last 72 hours.  Invalid input(s): PCO2, PO2  Studies/Results: DG Abd Portable 1V  Result Date: 10/27/2021 CLINICAL DATA:  Small bowel obstruction. EXAM: PORTABLE ABDOMEN - 1 VIEW COMPARISON:  Abdomen film yesterday at 12:56 a.m. FINDINGS: Contrast has been eliminated from the small bowel. There is scattered contrast and stool throughout a normal caliber large intestine to the rectum. There is still dilatation of central abdominal small bowel up to 4.1 cm, previously the maximum diameter  was 4.7 cm. Multiple surgical clips extend along both pelvic sidewalls. The visceral shadows are stable with cholecystectomy clips in right upper abdomen. Interval removal NGT.  No supine findings of free air. Degenerative change lumbar spine.  The lung bases generally clear. IMPRESSION: There is still significant dilatation of central abdominal small bowel to 4.1 cm but with improvement. Contrast has been eliminated from the small bowel since yesterday's exam. NGT has also been removed. Electronically Signed   By: Telford Nab M.D.   On: 10/27/2021 07:30    Anti-infectives: Anti-infectives (From admission, onward)    Start     Dose/Rate Route Frequency Ordered Stop   10/25/21 1500  ceFAZolin (ANCEF) IVPB 1 g/50 mL premix        1 g 100 mL/hr over 30 Minutes Intravenous Every 8 hours 10/25/21 1449 11/01/21 1359       Assessment/Plan: SBO, history cystectomy with ileal conduit, longstanding parastomal hernia  - favored to be unlikely due to hernia but adhesions in pelvis - now having bowel function. repeat xray 2/13 shows continued SB dilation but improved and no contrast - tolerating NGT out and soft diet  - stable for dc from general surgery perspective  FEN: soft ID: ancef VTE: pharm dvt prophylaxis is fine to proceed with  DM with hyperglycemia AAA  I reviewed hospitalist notes, last 24 h vitals and pain scores, last 48 h intake and output, and last 24 h labs and trends.  This care required low  level of medical decision making.   Winferd Humphrey, [facility] Surgery 10/28/2021, 8:10 AM Please see Amion for pager number during day hours 7:00am-4:30pm

## 2022-04-25 ENCOUNTER — Emergency Department (HOSPITAL_BASED_OUTPATIENT_CLINIC_OR_DEPARTMENT_OTHER): Payer: No Typology Code available for payment source

## 2022-04-25 ENCOUNTER — Other Ambulatory Visit: Payer: Self-pay

## 2022-04-25 ENCOUNTER — Emergency Department (HOSPITAL_BASED_OUTPATIENT_CLINIC_OR_DEPARTMENT_OTHER)
Admission: EM | Admit: 2022-04-25 | Discharge: 2022-04-25 | Disposition: A | Discharge status: Home / Self Care | Payer: No Typology Code available for payment source | Attending: Emergency Medicine | Admitting: Emergency Medicine

## 2022-04-25 ENCOUNTER — Encounter (HOSPITAL_BASED_OUTPATIENT_CLINIC_OR_DEPARTMENT_OTHER): Payer: Self-pay | Admitting: Emergency Medicine

## 2022-04-25 DIAGNOSIS — Z7984 Long term (current) use of oral hypoglycemic drugs: Secondary | ICD-10-CM | POA: Diagnosis not present

## 2022-04-25 DIAGNOSIS — M545 Low back pain, unspecified: Secondary | ICD-10-CM | POA: Insufficient documentation

## 2022-04-25 DIAGNOSIS — I714 Abdominal aortic aneurysm, without rupture, unspecified: Secondary | ICD-10-CM | POA: Diagnosis not present

## 2022-04-25 DIAGNOSIS — Z7982 Long term (current) use of aspirin: Secondary | ICD-10-CM | POA: Diagnosis not present

## 2022-04-25 DIAGNOSIS — Z794 Long term (current) use of insulin: Secondary | ICD-10-CM | POA: Diagnosis not present

## 2022-04-25 DIAGNOSIS — R0789 Other chest pain: Secondary | ICD-10-CM | POA: Insufficient documentation

## 2022-04-25 DIAGNOSIS — E119 Type 2 diabetes mellitus without complications: Secondary | ICD-10-CM | POA: Insufficient documentation

## 2022-04-25 LAB — CBC
HCT: 44.8 % (ref 39.0–52.0)
Hemoglobin: 15.4 g/dL (ref 13.0–17.0)
MCH: 29.2 pg (ref 26.0–34.0)
MCHC: 34.4 g/dL (ref 30.0–36.0)
MCV: 85 fL (ref 80.0–100.0)
Platelets: 325 10*3/uL (ref 150–400)
RBC: 5.27 MIL/uL (ref 4.22–5.81)
RDW: 13.3 % (ref 11.5–15.5)
WBC: 9.6 10*3/uL (ref 4.0–10.5)
nRBC: 0 % (ref 0.0–0.2)

## 2022-04-25 LAB — BASIC METABOLIC PANEL
Anion gap: 11 (ref 5–15)
BUN: 16 mg/dL (ref 8–23)
CO2: 23 mmol/L (ref 22–32)
Calcium: 10.1 mg/dL (ref 8.9–10.3)
Chloride: 103 mmol/L (ref 98–111)
Creatinine, Ser: 0.92 mg/dL (ref 0.61–1.24)
GFR, Estimated: >60 mL/min (ref >60)
Glucose, Bld: 202 mg/dL — ABNORMAL HIGH (ref 70–99)
Potassium: 4.4 mmol/L (ref 3.5–5.1)
Sodium: 137 mmol/L (ref 135–145)

## 2022-04-25 MED ORDER — IOHEXOL 300 MG/ML  SOLN
100.0000 mL | Freq: Once | INTRAMUSCULAR | Status: AC | PRN
  Administered 2022-04-25: 100 mL via INTRAVENOUS

## 2022-04-25 NOTE — ED Provider Notes (Addendum)
[facility] EMERGENCY DEPT Provider Note   CSN: [id number] Arrival date & time: 04/25/22  1516     History  Chief Complaint  Patient presents with   Back Pain    Shaun Pittman is a 66 y.o. male.  HPI 66 yo male presents today complaining of mid back pain.  Patient had fall in [place] last month.  He was seen and evaluated at the [place] with CT scan and found to have rib fx of l11 and l12, and transverse process fx of l1 and l2.  He reports that he has been managing pain with ibuprofen and tylenol.  Two days ago began having more neck and back pain.  He states that they told him to be reevaluated if pain changed. DM bs 118 this am BP increased to 190/99 no intervention with bp here 142/80 usually rungs 128/78 No blood thinners Took meclizine at noon as he felt lightheaded HO AAA at [facility] 3.2 last check on last ct     Home Medications Prior to Admission medications   Medication Sig Start Date End Date Taking? Authorizing Provider  amLODipine (NORVASC) 2.5 MG tablet Take 2.5 mg by mouth daily.     [provider]  aspirin EC 81 MG tablet Take 81 mg by mouth daily. Swallow whole.    [provider]  cholecalciferol (VITAMIN D3) 25 MCG (1000 UNIT) tablet Take 1,000 Units by mouth daily.    [provider]  cyclobenzaprine (FLEXERIL) 10 MG tablet Take 5 mg by mouth daily as needed for muscle spasms. 02/28/21   [provider]  famotidine-calcium carbonate-magnesium hydroxide (PEPCID COMPLETE) 10-800-165 MG chewable tablet Chew 1 tablet by mouth daily.    [provider]  ibuprofen (ADVIL) 200 MG tablet Take 200 mg by mouth every 6 (six) hours as needed for moderate pain.    [provider]  insulin glargine (LANTUS) 100 UNIT/ML injection Inject 40 Units into the skin daily.    [provider]  metFORMIN (GLUCOPHAGE-XR) 500 MG 24 hr tablet Take 1,000 mg by mouth 2 (two) times daily after a meal.    [provider]  Omega-3 Fatty Acids (FISH OIL) 1200 MG CAPS Take 1,200 mg by mouth daily.    [provider]  ondansetron (ZOFRAN) 4 MG tablet Take 1 tablet (4 mg total) by mouth every 8 (eight) hours as needed for nausea or vomiting. 10/28/21   Rai, Vernelle Emerald, MD  vitamin B-12 (CYANOCOBALAMIN) 100 MCG tablet Take 100 mcg by mouth daily.    [provider]      Allergies    Glipizide, Statins, Sulfamethoxazole-trimethoprim, Zithromax [azithromycin], Alogliptin, Bee venom, Gemfibrozil, Losartan, and Sulfa antibiotics    Review of Systems   Review of Systems  Physical Exam Updated Vital Signs BP (!) 153/83   Pulse 71   Temp 98.1 F (36.7 C)   Resp 14   Ht 1.829 m (6')   Wt 86.2 kg   SpO2 100%   BMI 25.77 kg/m  Physical Exam Vitals and nursing note reviewed.  Constitutional:      Appearance: Normal appearance.  HENT:     Head: Normocephalic and atraumatic.     Right Ear: External ear normal.     Left Ear: External ear normal.     Nose: Nose normal.     Mouth/Throat:     Pharynx: Oropharynx is clear.  Cardiovascular:     Rate and Rhythm: Normal rate.     Pulses: Normal pulses.  Pulmonary:     Effort: Pulmonary effort is normal.  Abdominal:     General: Abdomen is flat.     Palpations: Abdomen is soft.  Musculoskeletal:        General: Normal range of motion.     Cervical back: Normal range of motion.     Comments: No external signs of trauma, some ttp low back  Skin:    General: Skin is warm and dry.  Neurological:     General: No focal deficit present.     Mental Status: He is alert and oriented to person, place, and time.     Cranial Nerves: No cranial nerve deficit.     Sensory: No sensory deficit.     Motor: No weakness.     Coordination: Coordination normal.     Deep Tendon Reflexes: Reflexes normal.  Psychiatric:        Mood and Affect: Mood normal.     ED Results / Procedures / Treatments   Labs (all labs ordered are listed, but  only abnormal results are displayed) Labs Reviewed  BASIC METABOLIC PANEL - Abnormal; Notable for the following components:      Result Value   Glucose, Bld 202 (*)    All other components within normal limits  CBC    EKG None  Radiology CT CHEST ABDOMEN PELVIS W CONTRAST  Result Date: 04/25/2022 CLINICAL DATA:  Chest wall pain nontraumatic, infection or inflammation suspected; per report there are right eleventh and twelfth rib fractures and L1 and L2 transverse process fractures from a fall on 07/13; no interval traumatic injury. EXAM: CT CHEST, ABDOMEN, AND PELVIS WITH CONTRAST TECHNIQUE: Multidetector CT imaging of the chest, abdomen and pelvis was performed following the standard protocol during bolus administration of intravenous contrast. RADIATION DOSE REDUCTION: This exam was performed according to the departmental dose-optimization program which includes automated exposure control, adjustment of the mA and/or kV according to patient size and/or use of iterative reconstruction technique. CONTRAST:  185m OMNIPAQUE IOHEXOL 300 MG/ML  SOLN COMPARISON:  None Available. FINDINGS: CT CHEST FINDINGS Cardiovascular: Aortic and coronary artery atherosclerotic calcification. Normal heart size. No pericardial effusion. Mediastinum/Nodes: 1.3 cm short axis right hilar node. Esophagus is unremarkable. 1.0 cm left thyroid nodule. No follow-up imaging is recommended. The central airways are patent. Lungs/Pleura: Advanced emphysema. No focal consolidation, pleural effusion, or pneumothorax. Musculoskeletal: Mildly displaced posterior right eleventh and twelfth rib fractures. CT ABDOMEN PELVIS FINDINGS Hepatobiliary: No focal liver abnormality is seen. Status post cholecystectomy. No biliary dilatation. Pancreas: Unremarkable. No pancreatic ductal dilatation or surrounding inflammatory changes. Spleen: Peripherally enhancing 8.2 cm lesion in the spleen with centripetal filling on delayed images is compatible  with a benign hemangioma. Adrenals/Urinary Tract: No urinary calculi or hydronephrosis. No suspicious renal lesion. 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 Adrenal glands are unremarkable. Stomach/Bowel: Normal caliber large and small bowel. Normal appendix. Unremarkable stomach. Colonic diverticulosis without evidence of diverticulitis. Vascular/Lymphatic: Aortic atherosclerosis. 3.3 cm infrarenal abdominal aortic aneurysm. This is unchanged from 10/25/2021. No enlarged abdominal or pelvic lymph nodes. Reproductive: Prostate gland is surgically absent. Other: No free intraperitoneal fluid or air. Musculoskeletal: Right L1 and L2 transverse process fractures. No additional fractures. Advanced degenerative disc disease at L2-L3 no acute abnormality. IMPRESSION: 1. Displaced fractures of the posterior right eleventh and twelfth ribs as well as the L1 and L2 transverse processes. Per report these were are known from injury on 03/26/2022. No additional fractures or complications. 2. Status post radical  cystoprostatectomy. 3. Aortic Atherosclerosis (ICD10-I70.0) and Emphysema (ICD10-J43.9). 4. Unchanged 3.3 cm infrarenal abdominal aortic aneurysm. Follow-up every 3 years is recommended. This recommendation follows ACR consensus guidelines: White Paper of the ACR Incidental Findings committee II on Vascular Findings. J Am Coll Radiol 2013; 10:789-794. Electronically Signed   By: Placido Sou M.D.   On: 04/25/2022 19:30   CT L-SPINE NO CHARGE  Result Date: 04/25/2022 CLINICAL DATA:  Initial evaluation for new onset back pain for 3 days, recent fractures status post fall on July 13. EXAM: CT LUMBAR SPINE WITHOUT CONTRAST TECHNIQUE: Multidetector CT imaging of the lumbar spine was performed without intravenous contrast administration. Multiplanar CT image reconstructions were also generated. RADIATION DOSE REDUCTION: This exam was performed according to the departmental  dose-optimization program which includes automated exposure control, adjustment of the mA and/or kV according to patient size and/or use of iterative reconstruction technique. COMPARISON:  Prior study from 10/25/2021. No other previous imaging available for review. FINDINGS: Segmentation: Standard. Lowest well-formed disc space labeled the L5-S1 level. Alignment: Sigmoid scoliosis. 3 mm degenerative retrolisthesis of L2 on L3. Vertebrae: Evolving subacute mildly displaced fractures of the right transverse processes of L1 and L2 noted. There is an additional subacute comminuted fracture of the right posterior twelfth rib. No other acute fracture identified. Vertebral body height maintained. Visualized sacrum and pelvis intact. No worrisome osseous lesions. Paraspinal and other soft tissues: Visualized paraspinous soft tissues demonstrate no acute finding. Aortic atherosclerosis noted. Infrarenal aneurysm measures up to 3 cm in AP diameter. Disc levels: L1-2: Mild disc bulge with reactive endplate spurring. Mild facet hypertrophy. No significant spinal stenosis. Foramina remain patent. L2-3: Advanced degenerative intervertebral disc space narrowing with disc desiccation and diffuse disc bulge. Reactive endplate spurring. Mild right greater left facet hypertrophy. Mild narrowing of the right lateral recess. Central canal remains patent. Moderate right L2 foraminal stenosis. Left neural foramen remains patent. L3-4: Disc bulge with reactive endplate spurring. Mild to moderate facet and ligament flavum hypertrophy. Resultant mild-to-moderate spinal stenosis. Mild bilateral L3 foraminal narrowing. L4-5: Disc bulge with endplate spurring, asymmetric to the left. Moderate facet and ligament flavum hypertrophy. Resultant fairly severe spinal stenosis at this level. Mild left L4 foraminal narrowing. Right neural foramen remains patent. L5-S1: Mild disc bulge. Moderate bilateral facet hypertrophy. No significant spinal  stenosis. Foramina remain patent. IMPRESSION: 1. Evolving subacute mildly displaced fractures of the right transverse processes of L1 and L2, within additional subacute comminuted fracture of the right posterior twelfth rib. 2. No other acute osseous abnormality within the lumbar spine. 3. Moderate multilevel degenerative spondylosis, most pronounced at L4-5 where there is resultant severe spinal stenosis. 4. Infrarenal abdominal aortic aneurysm measuring up to 3 cm in AP diameter. Recommend follow-up every 3 years. This recommendation follows ACR consensus guidelines: White Paper of the ACR Incidental Findings Committee II on Vascular Findings. J Am Coll Radiol 2013; 10:789-794. Aortic Atherosclerosis (ICD10-I70.0). Electronically Signed   By: Jeannine Boga M.D.   On: 04/25/2022 19:25   CT Cervical Spine Wo Contrast  Result Date: 04/25/2022 CLINICAL DATA:  Acute neck pain EXAM: CT CERVICAL SPINE WITHOUT CONTRAST TECHNIQUE: Multidetector CT imaging of the cervical spine was performed without intravenous contrast. Multiplanar CT image reconstructions were also generated. RADIATION DOSE REDUCTION: This exam was performed according to the departmental dose-optimization program which includes automated exposure control, adjustment of the mA and/or kV according to patient size and/or use of iterative reconstruction technique. COMPARISON:  None Available. FINDINGS: Alignment: Loss of normal cervical lordosis is  likely chronic/positional. Skull base and vertebrae: No acute fracture. No primary bone lesion or focal pathologic process. Soft tissues and spinal canal: No prevertebral fluid or swelling. No visible canal hematoma. Disc levels: Multilevel advanced spondylosis, degenerative disc disease and degenerative endplate changes greatest in the lower cervical spine. Multilevel facet arthropathy greatest at C7-T1 on the right where it is advanced. Posterior disc osteophyte complexes cause multilevel spinal canal  narrowing greatest at C4-C5 and C5-C6 where it is moderate. Uncovertebral spurring and facet arthropathy cause multilevel neural foraminal narrowing which is advanced on the right at C4-C5, on the right at C5-C6, and bilaterally at C6-C7 Upper chest: Emphysema in the lung apices. Other: None. IMPRESSION: 1. No acute fracture of the cervical spine. 2. Multilevel cervical degenerative spondylosis and facet arthropathy. Electronically Signed   By: Placido Sou M.D.   On: 04/25/2022 19:09    Procedures Procedures    Medications Ordered in ED Medications  iohexol (OMNIPAQUE) 300 MG/ML solution 100 mL (100 mLs Intravenous Contrast Given 04/25/22 1820)    ED Course/ Medical Decision Making/ A&P Clinical Course as of 04/25/22 2108  Sat Apr 25, 2022  2107 CBC reviewed interpreted within normal limits [DR]  4481 Basic metabolic panel(!) Basic metabolic panel panel reviewed interpreted and significant hyperglycemia at 202 [DR]    Clinical Course User Index [DR] Pattricia Boss, MD                           Medical Decision Making 66 year old male with recent fall, multiple rib fractures, transverse process fractures of his lower lumbar spine.  He had a history of abdominal aortic aneurysm at 3.3 cm.  He was told to be reevaluated if he had any worsening pain.  Had some increased pain in his mid back today.  He presents secondary to this.  He was seen and evaluated here with CT scans.  There is no change in his fractures noted.  His abdominal aortic aneurysm is stable.  There does not appear to be any other acute cause of his worsening pain.  He appears stable for discharge.  Amount and/or Complexity of Data Reviewed Labs: ordered. Decision-making details documented in ED Course. Radiology: ordered and independent interpretation performed. Decision-making details documented in ED Course.  Risk Prescription drug management.            Final Clinical Impression(s) / ED Diagnoses Final  diagnoses:  Midline low back pain without sciatica, unspecified chronicity  Abdominal aortic aneurysm (AAA) without rupture, unspecified part [facility])    Rx / DC Orders ED Discharge Orders     None         Pattricia Boss, MD 04/25/22 2108    Pattricia Boss, MD 05/15/22 [phone]

## 2022-04-25 NOTE — ED Triage Notes (Signed)
Pt arrives to ED via [facility] with c/o neck and back pain x3 days ago. The back pain in thoracic in region. Pt reports right 11th and 12th rib fractures and L1 & L2 transverse process fracture after fall on 7/13. He reports he is worried about this new pain put denies any new injures. Hx AAA.

## 2022-04-25 NOTE — Discharge Instructions (Signed)
Your rib fractures and L1 and L2 fractures appear stable from prior Your abdominal aortic aneurysm is 3.3 cm and unchanged from prior studies Please follow-up as you have been scheduled for your future follow-up severe aneurysm Continue your current therapy for pain management Return if you are having any worsening symptoms

## 2022-04-25 NOTE — ED Notes (Signed)
Pt agreeable with d/c plan as discussed by provider- this nurse has verbally reinforced d/c instructions and provided pt with written copy- pt acknowledges verbal understanding and denies any additional questions, concerns, needs- ambulatory at d/c with steady gait; no distress.

## 2022-05-15 ENCOUNTER — Other Ambulatory Visit (HOSPITAL_COMMUNITY): Payer: Self-pay

## 2022-05-15 ENCOUNTER — Emergency Department (HOSPITAL_COMMUNITY): Payer: No Typology Code available for payment source

## 2022-05-15 ENCOUNTER — Emergency Department (HOSPITAL_COMMUNITY)
Admission: EM | Admit: 2022-05-15 | Discharge: 2022-05-15 | Disposition: A | Discharge status: Home / Self Care | Payer: No Typology Code available for payment source | Attending: Emergency Medicine | Admitting: Emergency Medicine

## 2022-05-15 ENCOUNTER — Encounter (HOSPITAL_COMMUNITY): Payer: Self-pay

## 2022-05-15 ENCOUNTER — Other Ambulatory Visit: Payer: Self-pay

## 2022-05-15 DIAGNOSIS — J439 Emphysema, unspecified: Secondary | ICD-10-CM | POA: Diagnosis not present

## 2022-05-15 DIAGNOSIS — Z794 Long term (current) use of insulin: Secondary | ICD-10-CM | POA: Diagnosis not present

## 2022-05-15 DIAGNOSIS — Z7982 Long term (current) use of aspirin: Secondary | ICD-10-CM | POA: Insufficient documentation

## 2022-05-15 DIAGNOSIS — Z20822 Contact with and (suspected) exposure to covid-19: Secondary | ICD-10-CM | POA: Diagnosis not present

## 2022-05-15 DIAGNOSIS — I10 Essential (primary) hypertension: Secondary | ICD-10-CM | POA: Diagnosis not present

## 2022-05-15 DIAGNOSIS — Z7984 Long term (current) use of oral hypoglycemic drugs: Secondary | ICD-10-CM | POA: Insufficient documentation

## 2022-05-15 DIAGNOSIS — F17200 Nicotine dependence, unspecified, uncomplicated: Secondary | ICD-10-CM | POA: Diagnosis not present

## 2022-05-15 DIAGNOSIS — R0602 Shortness of breath: Secondary | ICD-10-CM | POA: Diagnosis present

## 2022-05-15 DIAGNOSIS — Z7951 Long term (current) use of inhaled steroids: Secondary | ICD-10-CM | POA: Diagnosis not present

## 2022-05-15 DIAGNOSIS — Z79899 Other long term (current) drug therapy: Secondary | ICD-10-CM | POA: Diagnosis not present

## 2022-05-15 DIAGNOSIS — E119 Type 2 diabetes mellitus without complications: Secondary | ICD-10-CM | POA: Diagnosis not present

## 2022-05-15 DIAGNOSIS — Z8551 Personal history of malignant neoplasm of bladder: Secondary | ICD-10-CM | POA: Insufficient documentation

## 2022-05-15 LAB — RESP PANEL BY RT-PCR (FLU A&B, COVID) ARPGX2
Influenza A by PCR: NEGATIVE
Influenza B by PCR: NEGATIVE
SARS Coronavirus 2 by RT PCR: NEGATIVE

## 2022-05-15 LAB — CBC
HCT: 40.7 % (ref 39.0–52.0)
Hemoglobin: 14.1 g/dL (ref 13.0–17.0)
MCH: 29.6 pg (ref 26.0–34.0)
MCHC: 34.6 g/dL (ref 30.0–36.0)
MCV: 85.3 fL (ref 80.0–100.0)
Platelets: 292 10*3/uL (ref 150–400)
RBC: 4.77 MIL/uL (ref 4.22–5.81)
RDW: 13.1 % (ref 11.5–15.5)
WBC: 8.4 10*3/uL (ref 4.0–10.5)
nRBC: 0 % (ref 0.0–0.2)

## 2022-05-15 LAB — COMPREHENSIVE METABOLIC PANEL
ALT: 44 U/L (ref 0–44)
AST: 33 U/L (ref 15–41)
Albumin: 3.7 g/dL (ref 3.5–5.0)
Alkaline Phosphatase: 62 U/L (ref 38–126)
Anion gap: 9 (ref 5–15)
BUN: 13 mg/dL (ref 8–23)
CO2: 20 mmol/L — ABNORMAL LOW (ref 22–32)
Calcium: 9.2 mg/dL (ref 8.9–10.3)
Chloride: 108 mmol/L (ref 98–111)
Creatinine, Ser: 0.89 mg/dL (ref 0.61–1.24)
GFR, Estimated: >60 mL/min (ref >60)
Glucose, Bld: 230 mg/dL — ABNORMAL HIGH (ref 70–99)
Potassium: 4 mmol/L (ref 3.5–5.1)
Sodium: 137 mmol/L (ref 135–145)
Total Bilirubin: 0.8 mg/dL (ref 0.3–1.2)
Total Protein: 7 g/dL (ref 6.5–8.1)

## 2022-05-15 LAB — D-DIMER, QUANTITATIVE: D-Dimer, Quant: 0.4 ug/mL-FEU (ref 0.00–0.50)

## 2022-05-15 LAB — BRAIN NATRIURETIC PEPTIDE: B Natriuretic Peptide: 18.2 pg/mL (ref 0.0–100.0)

## 2022-05-15 MED ORDER — IPRATROPIUM-ALBUTEROL 0.5-2.5 (3) MG/3ML IN SOLN
3.0000 mL | Freq: Once | RESPIRATORY_TRACT | Status: AC
  Administered 2022-05-15: 3 mL via RESPIRATORY_TRACT
  Filled 2022-05-15: qty 3

## 2022-05-15 MED ORDER — IPRATROPIUM-ALBUTEROL 20-100 MCG/ACT IN AERS
1.0000 | INHALATION_SPRAY | Freq: Four times a day (QID) | RESPIRATORY_TRACT | 0 refills | Status: AC
  Filled 2022-05-15 (×2): qty 4, 30d supply, fill #0

## 2022-05-15 NOTE — ED Triage Notes (Addendum)
Pt from home via EMS with c/o intermittent SOB x2 mos since his fall where he broke 2 ribs and vertebrae. Pt also states that he has felt more weak since then as well. Pt states that his SOB was worse this morning and woke him. Per EMS lung sounds clear. Pt states that his SOB has eased up some. Pt also states that he is feeling some anxiety.

## 2022-05-15 NOTE — Progress Notes (Signed)
Post Peak flow meter completed Patient best = 410 Patient achieved 480  Patient tolerated well with no distress

## 2022-05-15 NOTE — Discharge Instructions (Addendum)
The test results today in the ED were reassuring.  There is no signs of pneumonia, broken ribs, heart failure or blood clots.  Try taking the inhaler to see if that helps with your symptoms.  Follow-up with your primary care doctor or consider seeing a lung specialist for further evaluation

## 2022-05-15 NOTE — ED Provider Notes (Signed)
[facility] DEPT Provider Note   CSN: [id number] Arrival date & time: 05/15/22  0849     History  Chief Complaint  Patient presents with   Shortness of Breath    Shaun Pittman is a 66 y.o. male.   Shortness of Breath    Patient has history of hypertension, diabetes, AAA, bladder cancer, pulmonary fibrosis, emphysema.  Patient called EMS this morning to be evaluated for shortness of breath ongoing for the last couple of months.  Patient states he had a fall where he broke his ribs and vertebrae a couple months ago.  He is having some generalized weakness and intermittent short of breath.  This morning when he woke up he was feeling worse.  Patient felt like evening trying to sit up in bed required more effort than usual.  Patient states he was instructed when he broke his ribs if he had any difficulty with his breathing he should come to the emergency room immediately. Patient also states he has been having some issues in the last couple of weeks with his blood pressure being elevated.  Home Medications Prior to Admission medications   Medication Sig Start Date End Date Taking? Authorizing Provider  Ipratropium-Albuterol (COMBIVENT) 20-100 MCG/ACT AERS respimat Inhale 1 puff into the lungs every 6 (six) hours. 05/15/22  Yes Dorie Rank, MD  amLODipine (NORVASC) 2.5 MG tablet Take 2.5 mg by mouth daily.     [provider]  aspirin EC 81 MG tablet Take 81 mg by mouth daily. Swallow whole.    [provider]  cholecalciferol (VITAMIN D3) 25 MCG (1000 UNIT) tablet Take 1,000 Units by mouth daily.    [provider]  cyclobenzaprine (FLEXERIL) 10 MG tablet Take 5 mg by mouth daily as needed for muscle spasms. 02/28/21   [provider]  famotidine-calcium carbonate-magnesium hydroxide (PEPCID COMPLETE) 10-800-165 MG chewable tablet Chew 1 tablet by mouth daily.    [provider]  ibuprofen (ADVIL) 200 MG tablet Take 200  mg by mouth every 6 (six) hours as needed for moderate pain.    [provider]  insulin glargine (LANTUS) 100 UNIT/ML injection Inject 40 Units into the skin daily.    [provider]  metFORMIN (GLUCOPHAGE-XR) 500 MG 24 hr tablet Take 1,000 mg by mouth 2 (two) times daily after a meal.    [provider]  Omega-3 Fatty Acids (FISH OIL) 1200 MG CAPS Take 1,200 mg by mouth daily.    [provider]  ondansetron (ZOFRAN) 4 MG tablet Take 1 tablet (4 mg total) by mouth every 8 (eight) hours as needed for nausea or vomiting. 10/28/21   Rai, Vernelle Emerald, MD  vitamin B-12 (CYANOCOBALAMIN) 100 MCG tablet Take 100 mcg by mouth daily.    [provider]      Allergies    Glipizide, Statins, Sulfamethoxazole-trimethoprim, Zithromax [azithromycin], Alogliptin, Bee venom, Gemfibrozil, Losartan, and Sulfa antibiotics    Review of Systems   Review of Systems  Respiratory:  Positive for shortness of breath.     Physical Exam Updated Vital Signs BP (!) 167/95   Pulse 88   Temp (!) 97.4 F (36.3 C) (Oral)   Resp 20   Ht 1.829 m (6')   Wt 86.2 kg   SpO2 98%   BMI 25.77 kg/m  Physical Exam Vitals and nursing note reviewed.  Constitutional:      Appearance: He is well-developed. He is not diaphoretic.  HENT:     Head:  Normocephalic and atraumatic.     Right Ear: External ear normal.     Left Ear: External ear normal.  Eyes:     General: No scleral icterus.       Right eye: No discharge.        Left eye: No discharge.     Conjunctiva/sclera: Conjunctivae normal.  Neck:     Trachea: No tracheal deviation.  Cardiovascular:     Rate and Rhythm: Normal rate and regular rhythm.  Pulmonary:     Effort: Pulmonary effort is normal. No respiratory distress.     Breath sounds: Normal breath sounds. No stridor. No wheezing or rales.  Abdominal:     General: Bowel sounds are normal. There is no distension.     Palpations: Abdomen is soft.     Tenderness:  There is no abdominal tenderness. There is no guarding or rebound.  Musculoskeletal:        General: No tenderness or deformity.     Cervical back: Neck supple.     Right lower leg: No edema.     Left lower leg: No edema.  Skin:    General: Skin is warm and dry.     Findings: No rash.  Neurological:     General: No focal deficit present.     Mental Status: He is alert.     Cranial Nerves: No cranial nerve deficit (no facial droop, extraocular movements intact, no slurred speech).     Sensory: No sensory deficit.     Motor: No abnormal muscle tone or seizure activity.     Coordination: Coordination normal.  Psychiatric:        Mood and Affect: Mood normal.     ED Results / Procedures / Treatments   Labs (all labs ordered are listed, but only abnormal results are displayed) Labs Reviewed  COMPREHENSIVE METABOLIC PANEL - Abnormal; Notable for the following components:      Result Value   CO2 20 (*)    Glucose, Bld 230 (*)    All other components within normal limits  RESP PANEL BY RT-PCR (FLU A&B, COVID) ARPGX2  CBC  BRAIN NATRIURETIC PEPTIDE  D-DIMER, QUANTITATIVE    EKG EKG Interpretation  Date/Time:  Friday May 15 2022 09:06:27 EDT Ventricular Rate:  98 PR Interval:  175 QRS Duration: 73 QT Interval:  317 QTC Calculation: 405 R Axis:   63 Text Interpretation: Sinus rhythm No significant change since last tracing Confirmed by Dorie Rank [id number]) on 05/15/2022 9:30:59 AM  Radiology DG Chest Port 1 View  Result Date: 05/15/2022 CLINICAL DATA:  Shortness of breath EXAM: PORTABLE CHEST 1 VIEW COMPARISON:  Chest radiograph 04/29/2021 FINDINGS: The heart size and mediastinal contours are within normal limits. Both lungs are clear. The visualized skeletal structures are unremarkable. IMPRESSION: No active disease. Electronically Signed   By: Lovey Newcomer M.D.   On: 05/15/2022 09:56    Procedures Procedures    Medications Ordered in ED Medications   ipratropium-albuterol (DUONEB) 0.5-2.5 (3) MG/3ML nebulizer solution 3 mL (3 mLs Nebulization Given 05/15/22 0941)    ED Course/ Medical Decision Making/ A&P Clinical Course as of 05/15/22 1114  Fri May 15, 2022  1004 Resp Panel by RT-PCR (Flu A&B, Covid) Anterior Nasal Swab [JK]  1004 CBC Normal [JK]  1044 Resp Panel by RT-PCR (Flu A&B, Covid) Anterior Nasal Swab nl [JK]  1044 D-dimer, quantitative nl [JK]  1044 Comprehensive metabolic panel(!) Hyperglycemia noted [JK]  1045 DG Chest Port 1 View No  acute changes [JK]  1048 Brain natriuretic peptide nl [JK]    Clinical Course User Index [JK] Dorie Rank, MD                           Medical Decision Making Problems Addressed: Essential hypertension: chronic illness or injury Shortness of breath: acute illness or injury  Amount and/or Complexity of Data Reviewed Labs: ordered. Decision-making details documented in ED Course. Radiology: ordered and independent interpretation performed. Decision-making details documented in ED Course.  Risk Prescription drug management.   Patient presented to ED for evaluation of shortness of breath.  Patient did have a recent fall and was diagnosed with rib fractures.  He was concerned about the possibility of complications associated with his rib fractures.  X-ray does not show any signs of pneumothorax.  He does not have any evidence of pneumonia.  No findings to suggest PE and he has a negative D-dimer.  Did not hear any wheezing on exam but he was given a breathing treatment.  Patient does have history of smoking and emphysema.  Its possible this could be causing his symptoms for him.  He is not hypoxic or tachypneic.  Other than hypertension which is a known condition for him that he sees a primary care doctor for his vitals are otherwise reassuring.  We will try him on a course of Combivent to see if that helps symptomatically.  Recommend outpatient follow-up with his PCP and possibly a  pulmonologist for further evaluation.  Patient also had concerns about his recent fall and injury.  I did review that record.  Did see the findings noting the rib fractures.  Patient states he feels weak all over but the imaging of the spine showed transverse spinous process fractures at L1 and L2.  This would not cause any global weakness for him and fortunately does not have any acute deficits noted on exam right now.  Patient does have plans to follow-up regarding these injuries as an outpatient.  He appears appropriate to do so  Evaluation and diagnostic testing in the emergency department does not suggest an emergent condition requiring admission or immediate intervention beyond what has been performed at this time.  The patient is safe for discharge and has been instructed to return immediately for worsening symptoms, change in symptoms or any other concerns.         Final Clinical Impression(s) / ED Diagnoses Final diagnoses:  Shortness of breath  Essential hypertension    Rx / DC Orders ED Discharge Orders          Ordered    Ipratropium-Albuterol (COMBIVENT) 20-100 MCG/ACT AERS respimat  Every 6 hours        05/15/22 1110              Dorie Rank, MD 05/15/22 1114

## 2023-02-17 ENCOUNTER — Other Ambulatory Visit (HOSPITAL_BASED_OUTPATIENT_CLINIC_OR_DEPARTMENT_OTHER): Payer: Self-pay

## 2023-02-17 ENCOUNTER — Encounter (HOSPITAL_BASED_OUTPATIENT_CLINIC_OR_DEPARTMENT_OTHER): Payer: Self-pay | Admitting: Emergency Medicine

## 2023-02-17 ENCOUNTER — Other Ambulatory Visit: Payer: Self-pay

## 2023-02-17 ENCOUNTER — Emergency Department (HOSPITAL_BASED_OUTPATIENT_CLINIC_OR_DEPARTMENT_OTHER)
Admission: EM | Admit: 2023-02-17 | Discharge: 2023-02-17 | Disposition: A | Discharge status: Home / Self Care | Payer: Non-veteran care | Attending: Emergency Medicine | Admitting: Emergency Medicine

## 2023-02-17 DIAGNOSIS — Z7982 Long term (current) use of aspirin: Secondary | ICD-10-CM | POA: Diagnosis not present

## 2023-02-17 DIAGNOSIS — Z794 Long term (current) use of insulin: Secondary | ICD-10-CM | POA: Insufficient documentation

## 2023-02-17 DIAGNOSIS — Z79899 Other long term (current) drug therapy: Secondary | ICD-10-CM | POA: Insufficient documentation

## 2023-02-17 DIAGNOSIS — H9203 Otalgia, bilateral: Secondary | ICD-10-CM | POA: Diagnosis not present

## 2023-02-17 DIAGNOSIS — R0981 Nasal congestion: Secondary | ICD-10-CM | POA: Insufficient documentation

## 2023-02-17 DIAGNOSIS — I10 Essential (primary) hypertension: Secondary | ICD-10-CM | POA: Insufficient documentation

## 2023-02-17 DIAGNOSIS — R519 Headache, unspecified: Secondary | ICD-10-CM | POA: Diagnosis present

## 2023-02-17 MED ORDER — LORATADINE 10 MG PO TABS
10.0000 mg | ORAL_TABLET | Freq: Every day | ORAL | 0 refills | Status: AC
  Filled 2023-02-17: qty 90, 90d supply, fill #0

## 2023-02-17 MED ORDER — FLUTICASONE PROPIONATE 50 MCG/ACT NA SUSP
1.0000 | Freq: Every day | NASAL | 2 refills | Status: AC
  Filled 2023-02-17: qty 16, 30d supply, fill #0

## 2023-02-17 MED ORDER — CEPHALEXIN 500 MG PO CAPS
500.0000 mg | ORAL_CAPSULE | Freq: Four times a day (QID) | ORAL | 0 refills | Status: AC
  Filled 2023-02-17: qty 28, 7d supply, fill #0

## 2023-02-17 NOTE — ED Provider Notes (Signed)
[facility] AT [facility] Provider Note   CSN: [id number] Arrival date & time: 02/17/23  4098     History  Chief Complaint  Patient presents with   Headache   Otalgia    Shaun Pittman is a 67 y.o. male.   Headache Associated symptoms: ear pain   Otalgia Associated symptoms: headaches      Patient presents to the ED for evaluation of headache and earaches sinus congestion.  Patient states he has had symptoms ongoing for several weeks off-and-on.  However in the last few days it has increased.  Patient states he is having pain behind his ear.  He is also having discomfort in both ears.  He has had some sinus congestion and a lot of drainage.  Patient states he has been diagnosed with sinusitis in the past.  He has seen an ENT doctor.  He is not having any trouble with focal numbness or weakness.  No trouble with balance or coordination.  No trouble with his speech or vision.  No fevers.  Patient states he checked his blood pressure this morning and it was 140 systolic.  He does have history of whitecoat hypertension  Home Medications Prior to Admission medications   Medication Sig Start Date End Date Taking? Authorizing Provider  cephALEXin (KEFLEX) 500 MG capsule Take 1 capsule (500 mg total) by mouth 4 (four) times daily. 02/17/23  Yes Linwood Dibbles, MD  fluticasone [facility]) 50 MCG/ACT nasal spray Place 1 spray into both nostrils daily. 02/17/23  Yes Linwood Dibbles, MD  loratadine (CLARITIN) 10 MG tablet Take 1 tablet (10 mg total) by mouth daily. 02/17/23  Yes Linwood Dibbles, MD  amLODipine (NORVASC) 2.5 MG tablet Take 2.5 mg by mouth daily.     [provider]  aspirin EC 81 MG tablet Take 81 mg by mouth daily. Swallow whole.    [provider]  cholecalciferol (VITAMIN D3) 25 MCG (1000 UNIT) tablet Take 1,000 Units by mouth daily.    [provider]  cyclobenzaprine (FLEXERIL) 10 MG tablet Take 5 mg by mouth daily as needed for muscle  spasms. 02/28/21   [provider]  famotidine-calcium carbonate-magnesium hydroxide (PEPCID COMPLETE) 10-800-165 MG chewable tablet Chew 1 tablet by mouth daily.    [provider]  ibuprofen (ADVIL) 200 MG tablet Take 200 mg by mouth every 6 (six) hours as needed for moderate pain.    [provider]  insulin glargine (LANTUS) 100 UNIT/ML injection Inject 40 Units into the skin daily.    [provider]  Ipratropium-Albuterol (COMBIVENT) 20-100 MCG/ACT AERS respimat Inhale 1 puff into the lungs every 6 (six) hours. 05/15/22   Linwood Dibbles, MD  metFORMIN (GLUCOPHAGE-XR) 500 MG 24 hr tablet Take 1,000 mg by mouth 2 (two) times daily after a meal.    [provider]  Omega-3 Fatty Acids (FISH OIL) 1200 MG CAPS Take 1,200 mg by mouth daily.    [provider]  ondansetron (ZOFRAN) 4 MG tablet Take 1 tablet (4 mg total) by mouth every 8 (eight) hours as needed for nausea or vomiting. 10/28/21   Rai, Delene Ruffini, MD  vitamin B-12 (CYANOCOBALAMIN) 100 MCG tablet Take 100 mcg by mouth daily.    [provider]      Allergies    Ciprofloxacin, Glipizide, Statins, Sulfamethoxazole-trimethoprim, Zithromax [azithromycin], Alogliptin, Bee venom, Gemfibrozil, Losartan, and Sulfa antibiotics    Review of Systems   Review of Systems  HENT:  Positive for ear pain.  Neurological:  Positive for headaches.    Physical Exam Updated Vital Signs BP (!) 174/95 (BP Location: Right Arm)   Pulse (!) 110   Temp 97.9 F (36.6 C) (Oral)   Resp 20   Ht 1.829 m (6')   Wt 86.2 kg   SpO2 98%   BMI 25.77 kg/m  Physical Exam Vitals and nursing note reviewed.  Constitutional:      General: He is not in acute distress.    Appearance: He is well-developed.  HENT:     Head: Normocephalic and atraumatic.     Right Ear: Tympanic membrane and external ear normal.     Left Ear: Tympanic membrane and external ear normal.  Eyes:     General: No visual field  deficit or scleral icterus.       Right eye: No discharge.        Left eye: No discharge.     Conjunctiva/sclera: Conjunctivae normal.  Neck:     Trachea: No tracheal deviation.  Cardiovascular:     Rate and Rhythm: Normal rate and regular rhythm.  Pulmonary:     Effort: Pulmonary effort is normal. No respiratory distress.     Breath sounds: Normal breath sounds. No stridor. No wheezing or rales.  Abdominal:     General: Bowel sounds are normal. There is no distension.     Palpations: Abdomen is soft.     Tenderness: There is no abdominal tenderness. There is no guarding or rebound.  Musculoskeletal:        General: No tenderness or deformity.     Cervical back: Neck supple.  Skin:    General: Skin is warm and dry.     Findings: No rash.  Neurological:     General: No focal deficit present.     Mental Status: He is alert.     Cranial Nerves: No cranial nerve deficit, dysarthria or facial asymmetry.     Sensory: No sensory deficit.     Motor: No weakness, abnormal muscle tone or seizure activity.     Coordination: Coordination normal.     Gait: Gait normal.  Psychiatric:        Mood and Affect: Mood normal.     ED Results / Procedures / Treatments   Labs (all labs ordered are listed, but only abnormal results are displayed) Labs Reviewed - No data to display  EKG None  Radiology No results found.  Procedures Procedures    Medications Ordered in ED Medications - No data to display  ED Course/ Medical Decision Making/ A&P                             Medical Decision Making Risk OTC drugs. Prescription drug management.  Patient presents to the ED for evaluation of sinus congestion and ear discomfort.  Patient does not have any evidence of otitis media on exam.  I do not appreciate any swelling or significant tenderness in the bilateral mastoid region.  Patient does not have any lymphadenopathy noted on exam.  He is not having any meningismus.  Patient does  report having sinus symptoms.  He states often gets better when he travels to different climates.  Suspect there is an allergy component.  Patient however states previously he took of course of antibiotics that helped.  He has had symptoms for several weeks I do think it is reasonable to start try a short course of antibiotics and will also  give him steroid nasal spray and antihistamines.  Discussed outpatient follow-up with PCP or ENT.  No neurodeficits noted.  No severe headache.  Do not feel that brain imaging is indicated at this time.        Final Clinical Impression(s) / ED Diagnoses Final diagnoses:  Sinus congestion  Hypertension, unspecified type    Rx / DC Orders ED Discharge Orders          Ordered    fluticasone (FLONASE) 50 MCG/ACT nasal spray  Daily        02/17/23 0909    loratadine (CLARITIN) 10 MG tablet  Daily        02/17/23 0909    cephALEXin (KEFLEX) 500 MG capsule  4 times daily        02/17/23 0909              Linwood Dibbles, MD 02/17/23 [phone]

## 2023-02-17 NOTE — Discharge Instructions (Addendum)
Take the antihistamine hands and nasal steroids to help with your sinus congestion.  Follow-up with your primary care doctor or consider seeing an ENT doctor for further evaluation.

## 2023-02-17 NOTE — ED Notes (Signed)
Pt given discharge instructions and reviewed prescriptions. Opportunities given for questions. Pt verbalizes understanding. Coulton Schlink R, RN 

## 2023-02-17 NOTE — ED Triage Notes (Signed)
Pt c/o pain in temporal areas bilaterally, earache for about 3 days. R ear pain radiates into shoulder. Hx of similar pain and was treated with Keflex.

## 2023-02-21 ENCOUNTER — Other Ambulatory Visit: Payer: Self-pay

## 2023-02-21 ENCOUNTER — Emergency Department (HOSPITAL_COMMUNITY)
Admission: EM | Admit: 2023-02-21 | Discharge: 2023-02-21 | Disposition: A | Discharge status: Home / Self Care | Payer: Non-veteran care | Attending: Emergency Medicine | Admitting: Emergency Medicine

## 2023-02-21 ENCOUNTER — Emergency Department (HOSPITAL_COMMUNITY): Payer: No Typology Code available for payment source

## 2023-02-21 ENCOUNTER — Emergency Department (HOSPITAL_COMMUNITY): Payer: Non-veteran care

## 2023-02-21 DIAGNOSIS — R0789 Other chest pain: Secondary | ICD-10-CM | POA: Insufficient documentation

## 2023-02-21 DIAGNOSIS — F419 Anxiety disorder, unspecified: Secondary | ICD-10-CM | POA: Insufficient documentation

## 2023-02-21 DIAGNOSIS — Z7984 Long term (current) use of oral hypoglycemic drugs: Secondary | ICD-10-CM | POA: Insufficient documentation

## 2023-02-21 DIAGNOSIS — R220 Localized swelling, mass and lump, head: Secondary | ICD-10-CM | POA: Insufficient documentation

## 2023-02-21 DIAGNOSIS — R0981 Nasal congestion: Secondary | ICD-10-CM | POA: Diagnosis not present

## 2023-02-21 DIAGNOSIS — E119 Type 2 diabetes mellitus without complications: Secondary | ICD-10-CM | POA: Insufficient documentation

## 2023-02-21 DIAGNOSIS — Z794 Long term (current) use of insulin: Secondary | ICD-10-CM | POA: Diagnosis not present

## 2023-02-21 DIAGNOSIS — I10 Essential (primary) hypertension: Secondary | ICD-10-CM | POA: Diagnosis not present

## 2023-02-21 DIAGNOSIS — Z79899 Other long term (current) drug therapy: Secondary | ICD-10-CM | POA: Diagnosis not present

## 2023-02-21 DIAGNOSIS — J439 Emphysema, unspecified: Secondary | ICD-10-CM | POA: Insufficient documentation

## 2023-02-21 DIAGNOSIS — Z7982 Long term (current) use of aspirin: Secondary | ICD-10-CM | POA: Diagnosis not present

## 2023-02-21 DIAGNOSIS — Z87891 Personal history of nicotine dependence: Secondary | ICD-10-CM | POA: Diagnosis not present

## 2023-02-21 DIAGNOSIS — Z8551 Personal history of malignant neoplasm of bladder: Secondary | ICD-10-CM | POA: Insufficient documentation

## 2023-02-21 LAB — BASIC METABOLIC PANEL
Anion gap: 13 (ref 5–15)
BUN: 12 mg/dL (ref 8–23)
CO2: 20 mmol/L — ABNORMAL LOW (ref 22–32)
Calcium: 9.2 mg/dL (ref 8.9–10.3)
Chloride: 102 mmol/L (ref 98–111)
Creatinine, Ser: 0.95 mg/dL (ref 0.61–1.24)
GFR, Estimated: >60 mL/min (ref >60)
Glucose, Bld: 211 mg/dL — ABNORMAL HIGH (ref 70–99)
Potassium: 4.1 mmol/L (ref 3.5–5.1)
Sodium: 135 mmol/L (ref 135–145)

## 2023-02-21 LAB — BRAIN NATRIURETIC PEPTIDE: B Natriuretic Peptide: 17.4 pg/mL (ref 0.0–100.0)

## 2023-02-21 LAB — T4, FREE: Free T4: 1.04 ng/dL (ref 0.61–1.12)

## 2023-02-21 LAB — TROPONIN I (HIGH SENSITIVITY)
Troponin I (High Sensitivity): 5 ng/L (ref <18)
Troponin I (High Sensitivity): 5 ng/L (ref <18)

## 2023-02-21 LAB — TSH: TSH: 2.562 u[IU]/mL (ref 0.350–4.500)

## 2023-02-21 MED ORDER — IOHEXOL 300 MG/ML  SOLN
75.0000 mL | Freq: Once | INTRAMUSCULAR | Status: AC | PRN
  Administered 2023-02-21: 75 mL via INTRAVENOUS

## 2023-02-21 NOTE — ED Triage Notes (Signed)
EMS reports from home called out for worsening behind right ear swelling X 1 week. Given ABX Thursday at [facility].  BP 180/100 HR 96 RR 22 Sp02 95 RA CBG 276  Pt states he took morning meds

## 2023-02-21 NOTE — Discharge Instructions (Addendum)
It was a pleasure caring for you today in the emergency department.  Please return to the emergency department for any worsening or worrisome symptoms.  Consider discussing use of the medication Atarax/Hydroxyzine with your pcp to help with anxiousness

## 2023-02-21 NOTE — ED Provider Notes (Signed)
[place] EMERGENCY DEPARTMENT AT [facility] Provider Note  CSN: [id number] Arrival date & time: 02/21/23 0848  Chief Complaint(s) Facial Swelling  HPI Shaun Pittman is a 67 y.o. male with past medical history as below, significant for AAA bladder cancer status post ileostomy, DM, HTN, emphysema, anxiety, thyroid nodule who presents to the ED with complaint of ear pain, palpitations, dyspnea.  Patient reports bilateral ear pain over the past week, now primarily to the right ear.  Intermittent tinnitus which is similar to his baseline.  Reports his hearing is more muffled on the right side.  Having worsening pain to the area surrounding his right ear.  No fevers or chills, nausea or vomiting.  Reports he woke up this morning with palpitations, chest tightness, lightheadedness which resolved spontaneously after few minutes.  Was seen at [facility] and started on antibiotics/antihistamine which he has been compliant with. No pain to his eyes or w/ eye movement, no redness or swelling to face that he has noticed. No dysphagia or dysphonia. No recent trauma to face/ear. Chronic sinus pressure/congestion reported.  No recent dental procedures  Past Medical History Past Medical History:  Diagnosis Date   AAA (abdominal aortic aneurysm) (HCC)    Bladder cancer (HCC)    has ileostomy   Diabetes mellitus without complication (HCC)    Diverticulitis 10/25/2021   Hypertension    Pulmonary emphysema (HCC) 10/25/2021   Pulmonary fibrosis (HCC) 10/25/2021   Pure hypertriglyceridemia 10/25/2021   Thyroid nodule 10/25/2021   Patient Active Problem List   Diagnosis Date Noted   SBO (small bowel obstruction) (HCC) 10/25/2021   Leukocytosis 10/25/2021   Type 2 diabetes mellitus with hyperglycemia (HCC) 10/25/2021   Thyroid nodule 10/25/2021   Pure hypertriglyceridemia 10/25/2021   Pulmonary fibrosis (HCC) 10/25/2021   Diverticulitis 10/25/2021   Pulmonary emphysema (HCC) 10/25/2021    Proteinuria 10/25/2021   Coronary artery calcification 10/25/2021   Aortic atherosclerosis (HCC) 10/25/2021   Cellulitis 10/25/2021   Sepsis due to undetermined organism (HCC) 06/04/2020   Hypertension    Type 2 diabetes mellitus (HCC)    Bladder cancer (HCC)    AAA (abdominal aortic aneurysm)    Splenic lesion 11/18/2019   Home Medication(s) Prior to Admission medications   Medication Sig Start Date End Date Taking? Authorizing Provider  amLODipine (NORVASC) 2.5 MG tablet Take 2.5 mg by mouth daily.     [provider]  aspirin EC 81 MG tablet Take 81 mg by mouth daily. Swallow whole.    [provider]  cephALEXin (KEFLEX) 500 MG capsule Take 1 capsule (500 mg total) by mouth 4 (four) times daily. 02/17/23   Linwood Dibbles, MD  cholecalciferol (VITAMIN D3) 25 MCG (1000 UNIT) tablet Take 1,000 Units by mouth daily.    [provider]  cyclobenzaprine (FLEXERIL) 10 MG tablet Take 5 mg by mouth daily as needed for muscle spasms. 02/28/21   [provider]  famotidine-calcium carbonate-magnesium hydroxide (PEPCID COMPLETE) 10-800-165 MG chewable tablet Chew 1 tablet by mouth daily.    [provider]  fluticasone (FLONASE) 50 MCG/ACT nasal spray Place 1 spray into both nostrils daily. 02/17/23   Linwood Dibbles, MD  ibuprofen (ADVIL) 200 MG tablet Take 200 mg by mouth every 6 (six) hours as needed for moderate pain.    [provider]  insulin glargine (LANTUS) 100 UNIT/ML injection Inject 40 Units into the skin daily.    [provider]  Ipratropium-Albuterol (COMBIVENT) 20-100 MCG/ACT AERS respimat Inhale 1 puff  into the lungs every 6 (six) hours. 05/15/22   Linwood Dibbles, MD  loratadine (CLARITIN) 10 MG tablet Take 1 tablet (10 mg total) by mouth daily. 02/17/23   Linwood Dibbles, MD  metFORMIN (GLUCOPHAGE-XR) 500 MG 24 hr tablet Take 1,000 mg by mouth 2 (two) times daily after a meal.    [provider]  Omega-3 Fatty Acids (FISH OIL) 1200 MG  CAPS Take 1,200 mg by mouth daily.    [provider]  ondansetron (ZOFRAN) 4 MG tablet Take 1 tablet (4 mg total) by mouth every 8 (eight) hours as needed for nausea or vomiting. 10/28/21   Rai, Delene Ruffini, MD  vitamin B-12 (CYANOCOBALAMIN) 100 MCG tablet Take 100 mcg by mouth daily.    [provider]                                                                                                                                    Past Surgical History Past Surgical History:  Procedure Laterality Date   BLADDER SURGERY     HERNIA REPAIR     ILEOSTOMY     Family History Family History  Problem Relation Age of Onset   Stroke Mother    Cancer - Lung Father    Diabetes Father    Bladder Cancer Sister    Stroke Maternal Grandfather    Stroke Paternal Grandfather    Stroke Paternal Grandmother     Social History Social History   Tobacco Use   Smoking status: Former    Packs/day: 1.00    Years: 40.00    Additional pack years: 0.00    Total pack years: 40.00    Types: Cigarettes    Quit date: 06/13/2020    Years since quitting: 2.6   Smokeless tobacco: Never   Tobacco comments:    down to 1/3-1/2 pack  Substance Use Topics   Alcohol use: Never   Drug use: Never   Allergies Ciprofloxacin, Glipizide, Statins, Sulfamethoxazole-trimethoprim, Zithromax [azithromycin], Alogliptin, Bee venom, Gemfibrozil, Losartan, and Sulfa antibiotics  Review of Systems Review of Systems  Constitutional:  Negative for chills and fever.  HENT:  Positive for congestion, sinus pressure, sinus pain and tinnitus. Negative for facial swelling and trouble swallowing.   Eyes:  Negative for photophobia and visual disturbance.  Respiratory:  Positive for chest tightness and shortness of breath. Negative for cough.   Cardiovascular:  Positive for palpitations. Negative for chest pain.  Gastrointestinal:  Negative for abdominal pain, nausea and vomiting.  Endocrine: Negative for  polydipsia and polyuria.  Genitourinary:  Negative for difficulty urinating and hematuria.  Musculoskeletal:  Negative for gait problem and joint swelling.  Skin:  Negative for pallor and rash.  Neurological:  Positive for light-headedness. Negative for syncope and headaches.  Psychiatric/Behavioral:  Negative for agitation and confusion.     Physical Exam Vital Signs  I have reviewed the triage vital signs BP (!) 147/90  Pulse 78   Temp 97.8 F (36.6 C) (Oral)   Resp (!) 9   SpO2 95%  Physical Exam Vitals and nursing note reviewed.  Constitutional:      General: He is not in acute distress.    Appearance: Normal appearance. He is well-developed. He is not ill-appearing.  HENT:     Head: Normocephalic and atraumatic. No raccoon eyes, Battle's sign, abrasion, right periorbital erythema or left periorbital erythema.     Jaw: There is normal jaw occlusion. No trismus or tenderness.     Right Ear: Ear canal normal. No tenderness. There is mastoid tenderness. Tympanic membrane is scarred.     Left Ear: Ear canal and external ear normal. No tenderness. No mastoid tenderness. Tympanic membrane is scarred.     Mouth/Throat:     Mouth: Mucous membranes are moist.     Pharynx: Oropharynx is clear. Uvula midline. No uvula swelling.     Comments: S/p dental extractions but no large burden of caries or obvious abscess, no angio-edema  Eyes:     General: No scleral icterus.    Extraocular Movements: Extraocular movements intact.     Pupils: Pupils are equal, round, and reactive to light.  Cardiovascular:     Rate and Rhythm: Normal rate and regular rhythm.     Pulses: Normal pulses.     Heart sounds: Normal heart sounds.  Pulmonary:     Effort: Pulmonary effort is normal. No respiratory distress.     Breath sounds: Normal breath sounds.  Abdominal:     General: Abdomen is flat.     Palpations: Abdomen is soft.     Tenderness: There is no abdominal tenderness.  Musculoskeletal:         General: Normal range of motion.     Cervical back: Normal range of motion. No rigidity. No pain with movement.     Right lower leg: No edema.     Left lower leg: No edema.  Skin:    General: Skin is warm and dry.     Capillary Refill: Capillary refill takes less than 2 seconds.  Neurological:     Mental Status: He is alert and oriented to person, place, and time.     GCS: GCS eye subscore is 4. GCS verbal subscore is 5. GCS motor subscore is 6.     Comments: No meningismus  Psychiatric:        Mood and Affect: Mood normal.        Behavior: Behavior normal. Behavior is cooperative.     ED Results and Treatments Labs (all labs ordered are listed, but only abnormal results are displayed) Labs Reviewed  BASIC METABOLIC PANEL - Abnormal; Notable for the following components:      Result Value   CO2 20 (*)    Glucose, Bld 211 (*)    All other components within normal limits  BRAIN NATRIURETIC PEPTIDE  TSH  T4, FREE  TROPONIN I (HIGH SENSITIVITY)  TROPONIN I (HIGH SENSITIVITY)  Radiology CT Temporal Bones W Contrast  Result Date: 02/21/2023 CLINICAL DATA:  Worsening swelling behind right ear for 1 week. EXAM: CT TEMPORAL BONES WITH CONTRAST TECHNIQUE: Axial and coronal plane CT imaging of the petrous temporal bones was performed with thin-collimation image reconstruction following intravenous contrast administration. Multiplanar CT image reconstructions were also generated. RADIATION DOSE REDUCTION: This exam was performed according to the departmental dose-optimization program which includes automated exposure control, adjustment of the mA and/or kV according to patient size and/or use of iterative reconstruction technique. CONTRAST:  75mL OMNIPAQUE IOHEXOL 300 MG/ML  SOLN COMPARISON:  CT head 11/05/2020 FINDINGS: RIGHT TEMPORAL BONE External auditory canal: There is  no significant soft tissue thickening along the walls of the external auditory canal. There is a small amount of debris medially, likely cerumen. The tympanic membrane is normal. Middle ear cavity: Normally aerated. The scutum and ossicles are normal. The tegmen tympani is intact. Inner ear structures: The cochlea, vestibule and semicircular canals are normal. The vestibular aqueduct is not enlarged. Internal auditory and facial nerve canals:  Normal Mastoid air cells: Normally aerated. No osseous erosion. LEFT TEMPORAL BONE External auditory canal: There is no significant soft tissue thickening along the walls of the external auditory canal. There is a small amount of debris medially, likely cerumen. The tympanic membrane is normal. Middle ear cavity: Normally aerated. The scutum and ossicles are normal. The tegmen tympani is intact. Inner ear structures: The cochlea, vestibule and semicircular canals are normal. The vestibular aqueduct is not enlarged. Internal auditory and facial nerve canals:  Normal. Mastoid air cells: Normally aerated. No osseous erosion. Vascular: Normal appearance of the carotid canals, jugular bulbs and sigmoid plates. Limited intracranial:  Unremarkable. Visible orbits/paranasal sinuses: A right lens implant is in place. The globes and orbits are otherwise unremarkable. There is mucosal thickening in the left sphenoid sinus. Soft tissues: There is mild stranding in the subcutaneous fat posterior to both ears. There is no organized fluid collection. IMPRESSION: 1. Mild stranding in the subcutaneous fat behind both ears is nonspecific but could reflect cellulitis; correlate with physical exam. No organized fluid collection. 2. Normal appearance of the temporal bones. Electronically Signed   By: Lesia Hausen M.D.   On: 02/21/2023 11:27   DG Chest 2 View  Result Date: 02/21/2023 CLINICAL DATA:  Palpitations EXAM: CHEST - 2 VIEW COMPARISON:  05/15/2022 chest radiograph. FINDINGS: Stable  cardiomediastinal silhouette with normal heart size. No pneumothorax. No pleural effusion. Lungs appear clear, with no acute consolidative airspace disease and no pulmonary edema. IMPRESSION: No active cardiopulmonary disease. Electronically Signed   By: Delbert Phenix M.D.   On: 02/21/2023 10:01    Pertinent labs & imaging results that were available during my care of the patient were reviewed by me and considered in my medical decision making (see MDM for details).  Medications Ordered in ED Medications  iohexol (OMNIPAQUE) 300 MG/ML solution 75 mL (75 mLs Intravenous Contrast Given 02/21/23 1032)  Procedures Procedures  (including critical care time)  Medical Decision Making / ED Course    Medical Decision Making:    Bryent Collman is a 67 y.o. male with past medical history as below, significant for AAA bladder cancer status post ileostomy, DM, HTN, emphysema, anxiety, thyroid nodule who presents to the ED with complaint of ear pain, palpitations, dyspnea.. The complaint involves an extensive differential diagnosis and also carries with it a high risk of complications and morbidity.  Serious etiology was considered. Ddx includes but is not limited to: Mastoiditis, TMJ, otitis externa, otitis media, sinusitis, sinus infection,Differential includes all life-threatening causes for chest pain. This includes but is not exclusive to acute coronary syndrome, aortic dissection, pulmonary embolism, cardiac tamponade, community-acquired pneumonia, pericarditis, musculoskeletal chest wall pain, metabolic derangement, endocrine disturbance etc.   Complete initial physical exam performed, notably the patient  was no acute distress, neuro nonfocal, HDS.    Reviewed and confirmed nursing documentation for past medical history, family history, social history.  Vital signs reviewed.         Labs reviewed and stable troponin x 2, TSH and free T4 were stable.  BMP stable. CT temporal with nonspecific fat stranding, no cellulitis on exam.  No obvious skin wounds or lesions periauricular area TM clear bilateral  Unclear etiology of patient's discomfort, possibly secondary to recurrent sinusitis.  He was started on antihistamine and antibiotic by prior physician.  Recommend patient complete this treatment course.  Follow-up with PCP for recheck in the next few days.  Return for any worsening worrisome symptoms.  Also reports increased anxiety, loneliness as he does not have any family members here locally.  He was started on trazodone previously which he thought made him too sleepy so he stopped taking.  Consider Atarax, he will talk to his PCP in regards to this as he is a [place] patient.  Given outpatient counseling resources.  No SI or HI.   The patient's chest pain is not suggestive of pulmonary embolus, cardiac ischemia, aortic dissection, pericarditis, myocarditis, pulmonary embolism, pneumothorax, pneumonia, Zoster, or esophageal perforation, or other serious etiology.  Historically not abrupt in onset, tearing or ripping, pulses symmetric. EKG nonspecific for ischemia/infarction. No dysrhythmias, brugada, WPW, prolonged QT noted.   Troponin negative x2. CXR reviewed. Labs without demonstration of acute pathology unless otherwise noted above. Low HEART Score  Given the extremely low risk of these diagnoses further testing and evaluation for these possibilities does not appear to be indicated at this time. Patient in no distress and overall condition improved here in the ED. Detailed discussions were had with the patient regarding current findings, and need for close f/u with PCP or on call doctor. The patient has been instructed to return immediately if the symptoms worsen in any way for re-evaluation. Patient verbalized understanding and is in agreement with current care plan.  All questions answered prior to discharge.         Additional history obtained: -Additional history obtained from na -External records from outside source obtained and reviewed including: Chart review including previous notes, labs, imaging, consultation notes including prior ED visit, PDMP, home medications   Lab Tests: -I ordered, reviewed, and interpreted labs.   The pertinent results include:   Labs Reviewed  BASIC METABOLIC PANEL - Abnormal; Notable for the following components:      Result Value   CO2 20 (*)    Glucose, Bld 211 (*)    All other components within normal limits  BRAIN NATRIURETIC PEPTIDE  TSH  T4, FREE  TROPONIN I (HIGH SENSITIVITY)  TROPONIN I (HIGH SENSITIVITY)    Notable for labs stable  EKG   EKG Interpretation  Date/Time:  Sunday February 21 2023 09:23:26 EDT Ventricular Rate:  83 PR Interval:  175 QRS Duration: 74 QT Interval:  341 QTC Calculation: 401 R Axis:   51 Text Interpretation: Sinus rhythm Confirmed by Tanda Rockers ([id number]) on 02/21/2023 12:49:55 PM         Imaging Studies ordered: I ordered imaging studies including CT temporal, chest x-ray I independently visualized the following imaging with scope of interpretation limited to determining acute life threatening conditions related to emergency care; findings noted above, significant for stable imaging I independently visualized and interpreted imaging. I agree with the radiologist interpretation   Medicines ordered and prescription drug management: Meds ordered this encounter  Medications   iohexol (OMNIPAQUE) 300 MG/ML solution 75 mL    -I have reviewed the patients home medicines and have made adjustments as needed   Consultations Obtained: na   Cardiac Monitoring: The patient was maintained on a cardiac monitor.  I personally viewed and interpreted the cardiac monitored which showed an underlying rhythm of: NSR  Social Determinants of Health:  Diagnosis or treatment  significantly limited by social determinants of health: former smoker   Reevaluation: After the interventions noted above, I reevaluated the patient and found that they have improved  Co morbidities that complicate the patient evaluation  Past Medical History:  Diagnosis Date   AAA (abdominal aortic aneurysm) (HCC)    Bladder cancer (HCC)    has ileostomy   Diabetes mellitus without complication (HCC)    Diverticulitis 10/25/2021   Hypertension    Pulmonary emphysema (HCC) 10/25/2021   Pulmonary fibrosis (HCC) 10/25/2021   Pure hypertriglyceridemia 10/25/2021   Thyroid nodule 10/25/2021      Dispostion: Disposition decision including need for hospitalization was considered, and patient discharged from emergency department.    Final Clinical Impression(s) / ED Diagnoses Final diagnoses:  Sinus congestion  Atypical chest pain  Anxiousness     This chart was dictated using voice recognition software.  Despite best efforts to proofread,  errors can occur which can change the documentation meaning.    Tanda Rockers A, DO 02/21/23 1411

## 2023-04-20 ENCOUNTER — Other Ambulatory Visit: Payer: Self-pay | Admitting: Internal Medicine

## 2023-04-20 DIAGNOSIS — E041 Nontoxic single thyroid nodule: Secondary | ICD-10-CM

## 2023-05-12 ENCOUNTER — Other Ambulatory Visit (HOSPITAL_COMMUNITY): Payer: Self-pay | Admitting: Internal Medicine

## 2023-05-12 DIAGNOSIS — E041 Nontoxic single thyroid nodule: Secondary | ICD-10-CM

## 2023-05-13 NOTE — Progress Notes (Signed)
Simonne Come, MD  Leodis Rains D Sounds good.  Oneita Kras       Previous Messages    ----- Message ----- From: Leodis Rains D Sent: 05/13/2023   1:25 PM EDT To: Simonne Come, MD Subject: RE: [place] FNA THYROID                            Per Call from [facility] and want [facility] done at [facility]

## 2023-05-13 NOTE — Progress Notes (Signed)
Simonne Come, MD  Leodis Rains D OK for US guided FNA of L 3.1 x 2.3 x 1.8 cm TR4 nodule.  Bx to be scheduled at [facility].  Nedra Hai

## 2023-05-15 ENCOUNTER — Other Ambulatory Visit (HOSPITAL_BASED_OUTPATIENT_CLINIC_OR_DEPARTMENT_OTHER): Payer: Self-pay

## 2023-05-15 ENCOUNTER — Emergency Department (HOSPITAL_BASED_OUTPATIENT_CLINIC_OR_DEPARTMENT_OTHER)
Admission: EM | Admit: 2023-05-15 | Discharge: 2023-05-15 | Disposition: A | Discharge status: Home / Self Care | Payer: Non-veteran care | Attending: Emergency Medicine | Admitting: Emergency Medicine

## 2023-05-15 ENCOUNTER — Encounter (HOSPITAL_BASED_OUTPATIENT_CLINIC_OR_DEPARTMENT_OTHER): Payer: Self-pay

## 2023-05-15 DIAGNOSIS — I10 Essential (primary) hypertension: Secondary | ICD-10-CM | POA: Insufficient documentation

## 2023-05-15 DIAGNOSIS — Z794 Long term (current) use of insulin: Secondary | ICD-10-CM | POA: Diagnosis not present

## 2023-05-15 DIAGNOSIS — Z7982 Long term (current) use of aspirin: Secondary | ICD-10-CM | POA: Insufficient documentation

## 2023-05-15 DIAGNOSIS — Z8551 Personal history of malignant neoplasm of bladder: Secondary | ICD-10-CM | POA: Diagnosis not present

## 2023-05-15 DIAGNOSIS — Z7984 Long term (current) use of oral hypoglycemic drugs: Secondary | ICD-10-CM | POA: Diagnosis not present

## 2023-05-15 DIAGNOSIS — Z79899 Other long term (current) drug therapy: Secondary | ICD-10-CM | POA: Diagnosis not present

## 2023-05-15 DIAGNOSIS — E119 Type 2 diabetes mellitus without complications: Secondary | ICD-10-CM | POA: Diagnosis not present

## 2023-05-15 DIAGNOSIS — R002 Palpitations: Secondary | ICD-10-CM | POA: Diagnosis present

## 2023-05-15 LAB — COMPREHENSIVE METABOLIC PANEL
ALT: 27 U/L (ref 0–44)
AST: 23 U/L (ref 15–41)
Albumin: 4.3 g/dL (ref 3.5–5.0)
Alkaline Phosphatase: 62 U/L (ref 38–126)
Anion gap: 11 (ref 5–15)
BUN: 10 mg/dL (ref 8–23)
CO2: 21 mmol/L — ABNORMAL LOW (ref 22–32)
Calcium: 9.1 mg/dL (ref 8.9–10.3)
Chloride: 103 mmol/L (ref 98–111)
Creatinine, Ser: 0.85 mg/dL (ref 0.61–1.24)
GFR, Estimated: >60 mL/min (ref >60)
Glucose, Bld: 196 mg/dL — ABNORMAL HIGH (ref 70–99)
Potassium: 3.9 mmol/L (ref 3.5–5.1)
Sodium: 135 mmol/L (ref 135–145)
Total Bilirubin: 0.7 mg/dL (ref 0.3–1.2)
Total Protein: 7.5 g/dL (ref 6.5–8.1)

## 2023-05-15 LAB — CBC
HCT: 45.4 % (ref 39.0–52.0)
Hemoglobin: 15.5 g/dL (ref 13.0–17.0)
MCH: 29.2 pg (ref 26.0–34.0)
MCHC: 34.1 g/dL (ref 30.0–36.0)
MCV: 85.7 fL (ref 80.0–100.0)
Platelets: 284 10*3/uL (ref 150–400)
RBC: 5.3 MIL/uL (ref 4.22–5.81)
RDW: 12.9 % (ref 11.5–15.5)
WBC: 9.6 10*3/uL (ref 4.0–10.5)
nRBC: 0 % (ref 0.0–0.2)

## 2023-05-15 LAB — TROPONIN I (HIGH SENSITIVITY): Troponin I (High Sensitivity): 5 ng/L (ref <18)

## 2023-05-15 LAB — MAGNESIUM: Magnesium: 1.4 mg/dL — ABNORMAL LOW (ref 1.7–2.4)

## 2023-05-15 MED ORDER — SODIUM CHLORIDE 0.9 % IV BOLUS
1000.0000 mL | Freq: Once | INTRAVENOUS | Status: AC
  Administered 2023-05-15: 1000 mL via INTRAVENOUS

## 2023-05-15 MED ORDER — MAGNESIUM SULFATE 2 GM/50ML IV SOLN
2.0000 g | Freq: Once | INTRAVENOUS | Status: AC
  Administered 2023-05-15: 2 g via INTRAVENOUS
  Filled 2023-05-15: qty 50

## 2023-05-15 NOTE — Discharge Instructions (Signed)
As discussed, your magnesium level was low at 1.4.  We gave you IV magnesium in the ED today to help replenish the levels.  Please follow-up with your primary care in 3 to 5 days for reevaluation of your symptoms and to redraw the magnesium levels to ensure that they are within normal limits.  Please take your magnesium supplement for the next 5 days.  Get help right away if: You have chest pain. You feel short of breath. You have a very bad headache. You feel dizzy. You faint.

## 2023-05-15 NOTE — ED Triage Notes (Signed)
He c/o "feeling my heart rate go up and down and "fluttering" sometimes", since 0400 today. His skin is normal, warm and dry and he is breathing normally. NSR per monitor; and he denies any pain or discomfort.

## 2023-05-15 NOTE — ED Notes (Signed)
He tells me he feels "real good".

## 2023-05-15 NOTE — ED Provider Notes (Signed)
[place] EMERGENCY DEPARTMENT AT [facility] Provider Note   CSN: [id number] Arrival date & time: 05/15/23  8295     History  Chief Complaint  Patient presents with   Palpitations    Shaun Pittman is a 67 y.o. male with a history of bladder cancer, type 2 diabetes, emphysema, and hypertension who presents the ED today for palpitations.  Patient reports that he woke up around 3:30 this morning feeling his heart fluttering in his chest.  He took his blood sugar and it was 86 at the time, he drank some juice and tried to go back to bed. He reports feeling palpitations and weak from that time until he got to the ED. Patient thinks his symptoms could be due to working out late at night prior to bed, anxiety, or depression.  He states that he seen cardiology in the past for tachycardia but has not seen them since.  Denies numbness or dizziness. No other concerns or complaints at this time.    Home Medications Prior to Admission medications   Medication Sig Start Date End Date Taking? Authorizing Provider  amLODipine (NORVASC) 2.5 MG tablet Take 2.5 mg by mouth daily.     [provider]  aspirin EC 81 MG tablet Take 81 mg by mouth daily. Swallow whole.    [provider]  cephALEXin (KEFLEX) 500 MG capsule Take 1 capsule (500 mg total) by mouth 4 (four) times daily. 02/17/23   Linwood Dibbles, MD  cholecalciferol (VITAMIN D3) 25 MCG (1000 UNIT) tablet Take 1,000 Units by mouth daily.    [provider]  cyclobenzaprine (FLEXERIL) 10 MG tablet Take 5 mg by mouth daily as needed for muscle spasms. 02/28/21   [provider]  famotidine-calcium carbonate-magnesium hydroxide (PEPCID COMPLETE) 10-800-165 MG chewable tablet Chew 1 tablet by mouth daily.    [provider]  fluticasone (FLONASE) 50 MCG/ACT nasal spray Place 1 spray into both nostrils daily. 02/17/23   Linwood Dibbles, MD  ibuprofen (ADVIL) 200 MG tablet Take 200 mg by mouth every 6 (six) hours as  needed for moderate pain.    [provider]  insulin glargine (LANTUS) 100 UNIT/ML injection Inject 40 Units into the skin daily.    [provider]  Ipratropium-Albuterol (COMBIVENT) 20-100 MCG/ACT AERS respimat Inhale 1 puff into the lungs every 6 (six) hours. 05/15/22   Linwood Dibbles, MD  loratadine (CLARITIN) 10 MG tablet Take 1 tablet (10 mg total) by mouth daily. 02/17/23   Linwood Dibbles, MD  metFORMIN (GLUCOPHAGE-XR) 500 MG 24 hr tablet Take 1,000 mg by mouth 2 (two) times daily after a meal.    [provider]  Omega-3 Fatty Acids (FISH OIL) 1200 MG CAPS Take 1,200 mg by mouth daily.    [provider]  ondansetron (ZOFRAN) 4 MG tablet Take 1 tablet (4 mg total) by mouth every 8 (eight) hours as needed for nausea or vomiting. 10/28/21   Rai, Delene Ruffini, MD  vitamin B-12 (CYANOCOBALAMIN) 100 MCG tablet Take 100 mcg by mouth daily.    [provider]      Allergies    Ciprofloxacin, Glipizide, Statins, Sulfamethoxazole-trimethoprim, Zithromax [azithromycin], Alogliptin, Bee venom, Gemfibrozil, Losartan, and Sulfa antibiotics    Review of Systems   Review of Systems  Cardiovascular:  Positive for palpitations.  All other systems reviewed and are negative.   Physical Exam Updated Vital Signs BP (!) 140/75   Pulse 69   Temp 97.9 F (36.6 C) (Oral)  Resp 13   SpO2 94%  Physical Exam Vitals and nursing note reviewed.  Constitutional:      General: He is not in acute distress.    Appearance: Normal appearance.  HENT:     Head: Normocephalic and atraumatic.     Nose: Nose normal.     Mouth/Throat:     Mouth: Mucous membranes are moist.  Eyes:     Conjunctiva/sclera: Conjunctivae normal.     Pupils: Pupils are equal, round, and reactive to light.  Cardiovascular:     Rate and Rhythm: Normal rate and regular rhythm.     Pulses: Normal pulses.     Heart sounds: Normal heart sounds.  Pulmonary:     Effort: Pulmonary effort is normal.      Breath sounds: Normal breath sounds.  Abdominal:     Palpations: Abdomen is soft.     Tenderness: There is no abdominal tenderness.  Musculoskeletal:     Right lower leg: No edema.     Left lower leg: No edema.  Skin:    General: Skin is warm and dry.     Findings: No rash.  Neurological:     General: No focal deficit present.     Mental Status: He is alert.     Sensory: No sensory deficit.     Motor: No weakness.     Gait: Gait normal.  Psychiatric:        Mood and Affect: Mood normal.        Behavior: Behavior normal.     ED Results / Procedures / Treatments   Labs (all labs ordered are listed, but only abnormal results are displayed) Labs Reviewed  COMPREHENSIVE METABOLIC PANEL - Abnormal; Notable for the following components:      Result Value   CO2 21 (*)    Glucose, Bld 196 (*)    All other components within normal limits  MAGNESIUM - Abnormal; Notable for the following components:   Magnesium 1.4 (*)    All other components within normal limits  CBC  TROPONIN I (HIGH SENSITIVITY)    EKG EKG Interpretation Date/Time:  Saturday May 15 2023 08:52:01 EDT Ventricular Rate:  98 PR Interval:  181 QRS Duration:  74 QT Interval:  320 QTC Calculation: 409 R Axis:   68  Text Interpretation: Sinus rhythm Borderline repolarization abnormality No significant change since last tracing Confirmed by Alvira Monday ([id number]) on 05/15/2023 9:17:19 AM  Radiology No results found.  Procedures Procedures: not indicated.   Medications Ordered in ED Medications  sodium chloride 0.9 % bolus 1,000 mL (0 mLs Intravenous Stopped 05/15/23 1159)  magnesium sulfate IVPB 2 g 50 mL (0 g Intravenous Stopped 05/15/23 1141)    ED Course/ Medical Decision Making/ A&P                                 Medical Decision Making Amount and/or Complexity of Data Reviewed Labs: ordered.  Risk Prescription drug management.   This patient presents to the ED for concern of  palpitations this involves an extensive number of treatment options, and is a complaint that carries with it a high risk of complications and morbidity.   Differential diagnosis includes: ACS, electrolyte imbalance, dehydration, hypoglycemia, acute anxiety attack, etc.   Comorbidities  See HPI above   Additional History  Additional history obtained from previous records.   Cardiac Monitoring / EKG  The patient was maintained on  a cardiac monitor.  I personally viewed and interpreted the cardiac monitored which showed: sinus rhythm with a heart rate of 98 bpm. No significant changes compared to prior EKG.   Lab Tests  I ordered and personally interpreted labs.  The pertinent results include:   Magnesium level of 1.4 CBC and CMP are within normal limits no acute infection, anemia, AKI, or electrolyte derangement Troponin of 5   Problem List / ED Course / Critical Interventions / Medication Management  Palpitations and weakness I ordered medications including: Normal saline  Magnesium for hypomagnesia Reevaluation of the patient after these medicines showed that the patient improved I have reviewed the patients home medicines and have made adjustments as needed   Social Determinants of Health  Social connection   Test / Admission - Considered  Discussed results with patient. Patient is hemodynamically stable and safe for discharge home. Advised to follow-up with his PCP and cardiologist. Return precautions given.       Final Clinical Impression(s) / ED Diagnoses Final diagnoses:  Palpitations  Hypomagnesemia    Rx / DC Orders ED Discharge Orders     None         Maxwell Marion, PA-C 05/15/23 1425    Alvira Monday, MD 05/17/23 1113

## 2023-06-21 ENCOUNTER — Ambulatory Visit (HOSPITAL_COMMUNITY)
Admission: RE | Admit: 2023-06-21 | Discharge: 2023-06-21 | Disposition: A | Discharge status: Home / Self Care | Payer: No Typology Code available for payment source | Source: Ambulatory Visit | Attending: Internal Medicine | Admitting: Internal Medicine

## 2023-06-21 DIAGNOSIS — E041 Nontoxic single thyroid nodule: Secondary | ICD-10-CM | POA: Insufficient documentation

## 2023-06-21 MED ORDER — LIDOCAINE HCL (PF) 1 % IJ SOLN
5.0000 mL | Freq: Once | INTRAMUSCULAR | Status: AC
  Administered 2023-06-21: 5 mL via INTRADERMAL

## 2023-06-23 LAB — CYTOLOGY - NON PAP

## 2024-02-12 ENCOUNTER — Other Ambulatory Visit: Payer: Self-pay

## 2024-02-12 ENCOUNTER — Emergency Department (HOSPITAL_BASED_OUTPATIENT_CLINIC_OR_DEPARTMENT_OTHER)
Admission: EM | Admit: 2024-02-12 | Discharge: 2024-02-12 | Disposition: A | Discharge status: Home / Self Care | Attending: Emergency Medicine | Admitting: Emergency Medicine

## 2024-02-12 ENCOUNTER — Encounter (HOSPITAL_BASED_OUTPATIENT_CLINIC_OR_DEPARTMENT_OTHER): Payer: Self-pay | Admitting: Emergency Medicine

## 2024-02-12 ENCOUNTER — Emergency Department (HOSPITAL_BASED_OUTPATIENT_CLINIC_OR_DEPARTMENT_OTHER): Admitting: Radiology

## 2024-02-12 DIAGNOSIS — Z794 Long term (current) use of insulin: Secondary | ICD-10-CM | POA: Diagnosis not present

## 2024-02-12 DIAGNOSIS — Z7982 Long term (current) use of aspirin: Secondary | ICD-10-CM | POA: Insufficient documentation

## 2024-02-12 DIAGNOSIS — E119 Type 2 diabetes mellitus without complications: Secondary | ICD-10-CM | POA: Diagnosis not present

## 2024-02-12 DIAGNOSIS — R911 Solitary pulmonary nodule: Secondary | ICD-10-CM | POA: Insufficient documentation

## 2024-02-12 DIAGNOSIS — R1319 Other dysphagia: Secondary | ICD-10-CM | POA: Diagnosis present

## 2024-02-12 MED ORDER — PANTOPRAZOLE SODIUM 40 MG PO TBEC: 40.0000 mg | DELAYED_RELEASE_TABLET | Freq: Every day | ORAL | 2 refills | Status: AC

## 2024-02-12 NOTE — ED Notes (Signed)
 Provided pt with water for PO challenge. Tolerating well. Denies difficulty swallowing, throat swelling or pain. No coughing present

## 2024-02-12 NOTE — ED Provider Notes (Signed)
 [place] EMERGENCY DEPARTMENT AT [facility] Provider Note   CSN: [id number] Arrival date & time: 02/12/24  1914     History {Add pertinent medical, surgical, social history, OB history to HPI:1} Chief Complaint  Patient presents with   Food Bolus    Shaun Pittman is a 67 y.o. male.  He has history of diabetes, emphysema, abdominal aortic aneurysm.  He said he was eating a meal tonight when he felt steak get caught going down in his esophagus.  It caused him chest pain and feeling short of breath.  He said he felt fluid rising into his chest.  Used his inhaler with some improvement.  He has had problems with food getting stuck before with this is the most severe.  [facility] that he has not had yet.  He has been doing some over-the-counter Pepcid for reflux symptoms.  He is feeling better here on arrival.  The history is provided by the patient.  Chest Pain Pain location:  Substernal area Pain quality: aching   Pain severity:  Moderate Onset quality:  Sudden Duration:  1 hour Progression:  Improving Chronicity:  Recurrent Context: eating   Associated symptoms: dysphagia, heartburn and shortness of breath   Associated symptoms: no abdominal pain, no cough, no diaphoresis and no fever        Home Medications Prior to Admission medications   Medication Sig Start Date End Date Taking? Authorizing Provider  amLODipine  (NORVASC ) 2.5 MG tablet Take 2.5 mg by mouth daily.     [provider]  aspirin  EC 81 MG tablet Take 81 mg by mouth daily. Swallow whole.    [provider]  cephALEXin  (KEFLEX ) 500 MG capsule Take 1 capsule (500 mg total) by mouth 4 (four) times daily. 02/17/23   Trish Furl, MD  cholecalciferol (VITAMIN D3) 25 MCG (1000 UNIT) tablet Take 1,000 Units by mouth daily.    [provider]  cyclobenzaprine  (FLEXERIL ) 10 MG tablet Take 5 mg by mouth daily as needed for muscle spasms. 02/28/21    [provider]  famotidine-calcium carbonate-magnesium  hydroxide (PEPCID COMPLETE) 10-800-165 MG chewable tablet Chew 1 tablet by mouth daily.    [provider]  fluticasone  (FLONASE ) 50 MCG/ACT nasal spray Place 1 spray into both nostrils daily. 02/17/23   Trish Furl, MD  ibuprofen (ADVIL) 200 MG tablet Take 200 mg by mouth every 6 (six) hours as needed for moderate pain.    [provider]  insulin  glargine (LANTUS) 100 UNIT/ML injection Inject 40 Units into the skin daily.    [provider]  Ipratropium-Albuterol  (COMBIVENT ) 20-100 MCG/ACT AERS respimat Inhale 1 puff into the lungs every 6 (six) hours. 05/15/22   Trish Furl, MD  loratadine  (CLARITIN ) 10 MG tablet Take 1 tablet (10 mg total) by mouth daily. 02/17/23   Trish Furl, MD  metFORMIN  (GLUCOPHAGE -XR) 500 MG 24 hr tablet Take 1,000 mg by mouth 2 (two) times daily after a meal.    [provider]  Omega-3 Fatty Acids (FISH OIL) 1200 MG CAPS Take 1,200 mg by mouth daily.    [provider]  ondansetron  (ZOFRAN ) 4 MG tablet Take 1 tablet (4 mg total) by mouth every 8 (eight) hours as needed for nausea or vomiting. 10/28/21   Rai, Hurman Maiden, MD  vitamin B-12 (CYANOCOBALAMIN) 100 MCG tablet Take 100 mcg by mouth daily.    [provider]      Allergies  Ciprofloxacin, Glipizide, Statins, Sulfamethoxazole-trimethoprim, Zithromax [azithromycin], Alogliptin, Bee venom, Gemfibrozil, Losartan, and Sulfa antibiotics    Review of Systems   Review of Systems  Constitutional:  Negative for diaphoresis and fever.  HENT:  Positive for trouble swallowing.   Respiratory:  Positive for shortness of breath. Negative for cough.   Cardiovascular:  Positive for chest pain.  Gastrointestinal:  Positive for heartburn. Negative for abdominal pain.    Physical Exam Updated Vital Signs BP (!) 170/91 (BP Location: Right Arm)   Pulse (!) 106   Temp 98.7 F (37.1 C)   Resp 20   SpO2 99%   Physical Exam Vitals and nursing note reviewed.  Constitutional:      General: He is not in acute distress.    Appearance: Normal appearance. He is well-developed.  HENT:     Head: Normocephalic and atraumatic.  Eyes:     Conjunctiva/sclera: Conjunctivae normal.  Cardiovascular:     Rate and Rhythm: Normal rate and regular rhythm.     Heart sounds: No murmur heard. Pulmonary:     Effort: Pulmonary effort is normal. No respiratory distress.     Breath sounds: Normal breath sounds.  Abdominal:     Palpations: Abdomen is soft.     Tenderness: There is no abdominal tenderness. There is no guarding or rebound.  Musculoskeletal:        General: No deformity.     Cervical back: Neck supple.  Skin:    General: Skin is warm and dry.     Capillary Refill: Capillary refill takes less than 2 seconds.  Neurological:     General: No focal deficit present.     Mental Status: He is alert.     ED Results / Procedures / Treatments   Labs (all labs ordered are listed, but only abnormal results are displayed) Labs Reviewed - No data to display  EKG None  Radiology No results found.  Procedures Procedures  {Document cardiac monitor, telemetry assessment procedure when appropriate:1}  Medications Ordered in ED Medications - No data to display  ED Course/ Medical Decision Making/ A&P   {   Click here for ABCD2, HEART and other calculatorsREFRESH Note before signing :1}                              Medical Decision Making Amount and/or Complexity of Data Reviewed Radiology: ordered.   This patient complains of ***; this involves an extensive number of treatment Options and is a complaint that carries with it a high risk of complications and morbidity. The differential includes ***  I ordered, reviewed and interpreted labs, which included *** I ordered medication *** and reviewed PMP when indicated. I ordered imaging studies which included *** and I independently     visualized and interpreted imaging which showed *** Additional history obtained from *** Previous records obtained and reviewed *** I consulted *** and discussed lab and imaging findings and discussed disposition.  Cardiac monitoring reviewed, *** Social determinants considered, *** Critical Interventions: ***  After the interventions stated above, I reevaluated the patient and found *** Admission and further testing considered, ***   {Document critical care time when appropriate:1} {Document review of labs and clinical decision tools ie heart score, Chads2Vasc2 etc:1}  {Document your independent review of radiology images, and any outside records:1} {Document your discussion with family members, caretakers, and with consultants:1} {Document social determinants of health affecting pt's care:1} {Document your decision making  why or why not admission, treatments were needed:1} Final Clinical Impression(s) / ED Diagnoses Final diagnoses:  None    Rx / DC Orders ED Discharge Orders     None

## 2024-02-12 NOTE — ED Triage Notes (Signed)
 Pt arrives POV w/ c/o food bolus after eating steak and tater tots. He states he feels like the food is still stuck in his esophagus & reports it is hard to swallow.

## 2024-02-12 NOTE — ED Notes (Signed)
 ED Provider at bedside.

## 2024-02-12 NOTE — ED Notes (Signed)
 Pt out of ED with steady gait, all belongings and paperwork not in visible distress. Rx sent to pt preferred pharmacy. Pt verbalized understanding

## 2024-02-12 NOTE — Discharge Instructions (Addendum)
 You are seen in the emergency department after having possible food stuck in your esophagus.  Your symptoms improved while you were here and you were able to swallow liquids without difficulty.  Your chest x-ray showed a pulmonary nodule that will need follow-up that can be arranged by your primary care doctor.  You will likely need an endoscopy to see if you have any type of narrowing of your esophagus.  Please start with a soft diet and make sure you chew your food very well.  We are starting you on some stronger acid medication for reflux.  Return if any worsening or concerning symptoms
